# Patient Record
Sex: Female | Born: 1975 | Race: White | Hispanic: No | Marital: Single | Smoking: Never smoker
Health system: Southern US, Community
[De-identification: ages and names within clinical notes are randomized; demographics above are authoritative.]

## PROBLEM LIST (undated history)

## (undated) DIAGNOSIS — R002 Palpitations: Secondary | ICD-10-CM

## (undated) DIAGNOSIS — F419 Anxiety disorder, unspecified: Secondary | ICD-10-CM

## (undated) HISTORY — PX: OOPHORECTOMY: SHX86

## (undated) HISTORY — PX: CHOLECYSTECTOMY: SHX55

---

## 2000-06-09 ENCOUNTER — Inpatient Hospital Stay (HOSPITAL_COMMUNITY): Admission: AD | Admit: 2000-06-09 | Discharge: 2000-06-09 | Payer: Self-pay | Admitting: Obstetrics and Gynecology

## 2000-06-09 ENCOUNTER — Encounter: Payer: Self-pay | Admitting: Obstetrics and Gynecology

## 2001-09-19 ENCOUNTER — Encounter: Payer: Self-pay | Admitting: Emergency Medicine

## 2001-09-19 ENCOUNTER — Emergency Department (HOSPITAL_COMMUNITY): Admission: EM | Admit: 2001-09-19 | Discharge: 2001-09-19 | Payer: Self-pay | Admitting: Emergency Medicine

## 2002-06-01 ENCOUNTER — Emergency Department (HOSPITAL_COMMUNITY): Admission: EM | Admit: 2002-06-01 | Discharge: 2002-06-01 | Payer: Self-pay | Admitting: Emergency Medicine

## 2003-05-06 ENCOUNTER — Emergency Department (HOSPITAL_COMMUNITY): Admission: EM | Admit: 2003-05-06 | Discharge: 2003-05-06 | Payer: Self-pay | Admitting: Emergency Medicine

## 2004-05-08 ENCOUNTER — Inpatient Hospital Stay (HOSPITAL_COMMUNITY): Admission: AD | Admit: 2004-05-08 | Discharge: 2004-05-08 | Payer: Self-pay | Admitting: Obstetrics and Gynecology

## 2004-06-08 ENCOUNTER — Other Ambulatory Visit: Admission: RE | Admit: 2004-06-08 | Discharge: 2004-06-08 | Payer: Self-pay | Admitting: Obstetrics and Gynecology

## 2004-08-22 ENCOUNTER — Ambulatory Visit (HOSPITAL_COMMUNITY): Admission: RE | Admit: 2004-08-22 | Discharge: 2004-08-22 | Payer: Self-pay | Admitting: Obstetrics and Gynecology

## 2004-10-23 ENCOUNTER — Inpatient Hospital Stay (HOSPITAL_COMMUNITY): Admission: AD | Admit: 2004-10-23 | Discharge: 2004-10-23 | Payer: Self-pay | Admitting: Obstetrics and Gynecology

## 2005-01-02 ENCOUNTER — Inpatient Hospital Stay (HOSPITAL_COMMUNITY): Admission: AD | Admit: 2005-01-02 | Discharge: 2005-01-04 | Payer: Self-pay | Admitting: Obstetrics and Gynecology

## 2005-01-10 ENCOUNTER — Ambulatory Visit (HOSPITAL_COMMUNITY): Admission: RE | Admit: 2005-01-10 | Discharge: 2005-01-10 | Payer: Self-pay | Admitting: Obstetrics and Gynecology

## 2005-03-04 ENCOUNTER — Emergency Department (HOSPITAL_COMMUNITY): Admission: EM | Admit: 2005-03-04 | Discharge: 2005-03-05 | Payer: Self-pay | Admitting: Emergency Medicine

## 2005-04-20 ENCOUNTER — Emergency Department (HOSPITAL_COMMUNITY): Admission: EM | Admit: 2005-04-20 | Discharge: 2005-04-20 | Payer: Self-pay | Admitting: Emergency Medicine

## 2006-02-14 ENCOUNTER — Emergency Department (HOSPITAL_COMMUNITY): Admission: EM | Admit: 2006-02-14 | Discharge: 2006-02-14 | Payer: Self-pay | Admitting: Emergency Medicine

## 2006-05-30 ENCOUNTER — Emergency Department (HOSPITAL_COMMUNITY): Admission: EM | Admit: 2006-05-30 | Discharge: 2006-05-30 | Payer: Self-pay | Admitting: Emergency Medicine

## 2006-08-08 ENCOUNTER — Other Ambulatory Visit: Admission: RE | Admit: 2006-08-08 | Discharge: 2006-08-08 | Payer: Self-pay | Admitting: Obstetrics and Gynecology

## 2007-12-08 ENCOUNTER — Encounter: Admission: RE | Admit: 2007-12-08 | Discharge: 2007-12-08 | Payer: Self-pay | Admitting: Obstetrics and Gynecology

## 2007-12-10 ENCOUNTER — Encounter: Admission: RE | Admit: 2007-12-10 | Discharge: 2007-12-10 | Payer: Self-pay | Admitting: Obstetrics and Gynecology

## 2007-12-21 ENCOUNTER — Emergency Department (HOSPITAL_COMMUNITY): Admission: EM | Admit: 2007-12-21 | Discharge: 2007-12-21 | Payer: Self-pay | Admitting: Emergency Medicine

## 2007-12-30 ENCOUNTER — Emergency Department (HOSPITAL_COMMUNITY): Admission: EM | Admit: 2007-12-30 | Discharge: 2007-12-30 | Payer: Self-pay | Admitting: Emergency Medicine

## 2008-01-23 ENCOUNTER — Ambulatory Visit (HOSPITAL_COMMUNITY): Admission: RE | Admit: 2008-01-23 | Discharge: 2008-01-23 | Payer: Self-pay | Admitting: General Surgery

## 2008-04-14 ENCOUNTER — Emergency Department (HOSPITAL_COMMUNITY): Admission: EM | Admit: 2008-04-14 | Discharge: 2008-04-14 | Payer: Self-pay | Admitting: Emergency Medicine

## 2008-05-17 ENCOUNTER — Emergency Department (HOSPITAL_COMMUNITY): Admission: EM | Admit: 2008-05-17 | Discharge: 2008-05-17 | Payer: Self-pay | Admitting: Emergency Medicine

## 2009-08-26 ENCOUNTER — Emergency Department: Payer: Self-pay | Admitting: Emergency Medicine

## 2010-08-22 ENCOUNTER — Emergency Department: Payer: Self-pay | Admitting: Emergency Medicine

## 2011-01-07 ENCOUNTER — Encounter: Payer: Self-pay | Admitting: Obstetrics and Gynecology
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
## 2011-05-09 ENCOUNTER — Emergency Department: Payer: Self-pay | Admitting: Unknown Physician Specialty

## 2011-07-06 ENCOUNTER — Emergency Department (HOSPITAL_COMMUNITY)
Admission: EM | Admit: 2011-07-06 | Discharge: 2011-07-06 | Disposition: A | Discharge status: Home / Self Care | Payer: Medicaid Other | Attending: Emergency Medicine | Admitting: Emergency Medicine

## 2011-07-06 ENCOUNTER — Emergency Department (HOSPITAL_COMMUNITY): Payer: Medicaid Other

## 2011-07-06 DIAGNOSIS — R079 Chest pain, unspecified: Secondary | ICD-10-CM | POA: Insufficient documentation

## 2011-07-06 DIAGNOSIS — R0602 Shortness of breath: Secondary | ICD-10-CM | POA: Insufficient documentation

## 2011-07-06 DIAGNOSIS — R6884 Jaw pain: Secondary | ICD-10-CM | POA: Insufficient documentation

## 2011-07-06 DIAGNOSIS — R0989 Other specified symptoms and signs involving the circulatory and respiratory systems: Secondary | ICD-10-CM | POA: Insufficient documentation

## 2011-07-06 DIAGNOSIS — R209 Unspecified disturbances of skin sensation: Secondary | ICD-10-CM | POA: Insufficient documentation

## 2011-07-06 DIAGNOSIS — R0609 Other forms of dyspnea: Secondary | ICD-10-CM | POA: Insufficient documentation

## 2011-07-06 DIAGNOSIS — R110 Nausea: Secondary | ICD-10-CM | POA: Insufficient documentation

## 2011-07-06 DIAGNOSIS — M79609 Pain in unspecified limb: Secondary | ICD-10-CM | POA: Insufficient documentation

## 2011-07-06 LAB — URINALYSIS, ROUTINE W REFLEX MICROSCOPIC
Glucose, UA: NEGATIVE mg/dL
Leukocytes, UA: NEGATIVE
Specific Gravity, Urine: 1.011 (ref 1.005–1.030)
pH: 6 (ref 5.0–8.0)

## 2011-07-06 LAB — DIFFERENTIAL
Basophils Absolute: 0 10*3/uL (ref 0.0–0.1)
Eosinophils Absolute: 0.2 10*3/uL (ref 0.0–0.7)
Lymphocytes Relative: 21 % (ref 12–46)
Lymphs Abs: 1.9 10*3/uL (ref 0.7–4.0)

## 2011-07-06 LAB — URINE MICROSCOPIC-ADD ON

## 2011-07-06 LAB — POCT I-STAT, CHEM 8
Chloride: 104 mEq/L (ref 96–112)
Glucose, Bld: 91 mg/dL (ref 70–99)
HCT: 41 % (ref 36.0–46.0)
Potassium: 3.8 mEq/L (ref 3.5–5.1)
Sodium: 141 mEq/L (ref 135–145)

## 2011-07-06 LAB — CK TOTAL AND CKMB (NOT AT ARMC)
Relative Index: INVALID (ref 0.0–2.5)
Total CK: 41 U/L (ref 7–177)
Total CK: 39 U/L (ref 7–177)

## 2011-07-06 LAB — CBC
HCT: 37.8 % (ref 36.0–46.0)
MCH: 32.4 pg (ref 26.0–34.0)

## 2011-09-06 LAB — RAPID STREP SCREEN (MED CTR MEBANE ONLY): Streptococcus, Group A Screen (Direct): NEGATIVE

## 2011-09-07 LAB — URINE MICROSCOPIC-ADD ON

## 2011-09-07 LAB — URINALYSIS, ROUTINE W REFLEX MICROSCOPIC
Glucose, UA: NEGATIVE
Ketones, ur: NEGATIVE
Nitrite: NEGATIVE

## 2011-09-07 LAB — CBC
HCT: 38
Platelets: 257
RBC: 4.28
RDW: 12.7
WBC: 7.4

## 2011-09-07 LAB — DIFFERENTIAL
Basophils Relative: 0
Lymphs Abs: 2.4
Monocytes Absolute: 0.4
Monocytes Relative: 6
Neutrophils Relative %: 59

## 2011-09-07 LAB — COMPREHENSIVE METABOLIC PANEL
ALT: 19
AST: 17
Albumin: 3.5
CO2: 25
Calcium: 9.2
Sodium: 135

## 2011-09-07 LAB — PROTIME-INR: Prothrombin Time: 12.5

## 2011-09-13 LAB — POCT INFECTIOUS MONO SCREEN: Mono Screen: NEGATIVE

## 2011-09-13 LAB — POCT RAPID STREP A: Streptococcus, Group A Screen (Direct): NEGATIVE

## 2011-09-27 ENCOUNTER — Ambulatory Visit: Payer: Self-pay | Admitting: Emergency Medicine

## 2011-10-04 ENCOUNTER — Ambulatory Visit: Payer: Self-pay | Admitting: Emergency Medicine

## 2011-10-30 ENCOUNTER — Encounter: Payer: Self-pay | Admitting: *Deleted

## 2011-10-30 ENCOUNTER — Other Ambulatory Visit: Payer: Self-pay

## 2011-10-30 ENCOUNTER — Emergency Department (HOSPITAL_COMMUNITY): Payer: Medicaid Other

## 2011-10-30 ENCOUNTER — Emergency Department (HOSPITAL_COMMUNITY)
Admission: EM | Admit: 2011-10-30 | Discharge: 2011-10-30 | Disposition: A | Discharge status: Home / Self Care | Payer: Medicaid Other | Attending: Emergency Medicine | Admitting: Emergency Medicine

## 2011-10-30 DIAGNOSIS — R079 Chest pain, unspecified: Secondary | ICD-10-CM | POA: Insufficient documentation

## 2011-10-30 DIAGNOSIS — K219 Gastro-esophageal reflux disease without esophagitis: Secondary | ICD-10-CM | POA: Insufficient documentation

## 2011-10-30 DIAGNOSIS — R0602 Shortness of breath: Secondary | ICD-10-CM | POA: Insufficient documentation

## 2011-10-30 LAB — POCT I-STAT TROPONIN I

## 2011-10-30 LAB — POCT I-STAT, CHEM 8
Creatinine, Ser: 0.8 mg/dL (ref 0.50–1.10)
HCT: 40 % (ref 36.0–46.0)
Hemoglobin: 13.6 g/dL (ref 12.0–15.0)
Potassium: 3.6 mEq/L (ref 3.5–5.1)
Sodium: 139 mEq/L (ref 135–145)
TCO2: 26 mmol/L (ref 0–100)

## 2011-10-30 MED ORDER — ESOMEPRAZOLE MAGNESIUM 40 MG PO CPDR: DELAYED_RELEASE_CAPSULE | ORAL | Status: AC

## 2011-10-30 MED ORDER — SUCRALFATE 1 G PO TABS: 1.0000 g | ORAL_TABLET | Freq: Four times a day (QID) | ORAL | Status: AC

## 2011-10-30 NOTE — ED Provider Notes (Signed)
History     CSN: [id number] Arrival date & time: 10/30/2011 12:11 PM   First MD Initiated Contact with Patient 10/30/11 1354      Chief Complaint  Patient presents with  . Chest Pain    (Consider location/radiation/quality/duration/timing/severity/associated sxs/prior treatment) HPI To ed for eval of sob and chest pain. Describes as burning pain. Intermittent. Pain and sob started past gallbladder surgery 4 wks ago. States past surgery she felt 'bad' upper thigh pain. Seen by pmd today and has endo scheduled and cardiology appt next week. Pt appears in nad. Skin w/d, resp e/u.  Had similar complaint and ED visit last month.  History of hiatal hernia which she takes Nexium for.  Describes "burning" .  Not pleuritic.  No hx of DVT or PE.  No extremity swelling.  No diaphoresis.    History reviewed. No pertinent past medical history.  Past Surgical History  Procedure Date  . Cholecystectomy   . Oophorectomy     History reviewed. No pertinent family history.  History  Substance Use Topics  . Smoking status: Not on file  . Smokeless tobacco: Not on file  . Alcohol Use: No    OB History    Grav Para Term Preterm Abortions TAB SAB Ect Mult Living                  Review of Systems  Constitutional: Negative.   HENT: Negative.   Eyes: Negative.   Musculoskeletal: Negative.   Neurological: Negative.   Hematological: Negative.   Psychiatric/Behavioral: Negative.     Allergies  Sulfa antibiotics  Home Medications   Current Outpatient Rx  Name Route Sig Dispense Refill  . ALPRAZOLAM 0.5 MG PO TABS Oral Take 0.5 mg by mouth 2 (two) times daily as needed. For anxiety     . VITAMIN C 1000 MG PO TABS Oral Take 1,000 mg by mouth daily.      Marland Kitchen VITAMIN D 1000 UNITS PO TABS Oral Take 1,000 Units by mouth daily.      Marland Kitchen ESOMEPRAZOLE MAGNESIUM 40 MG PO CPDR Oral Take 40 mg by mouth daily before breakfast.      . IBUPROFEN 800 MG PO TABS Oral Take 800 mg by mouth every 8 (eight)  hours as needed. For pain     . VITAMIN B-12 1000 MCG PO TABS Oral Take 1,000 mcg by mouth daily.      Marland Kitchen VITAMIN E 400 UNITS PO CAPS Oral Take 800 Units by mouth daily.        BP 121/81  Pulse 93  Temp(Src) 98.2 F (36.8 C) (Oral)  Resp 20  SpO2 99%  LMP 10/09/2011  Physical Exam  Nursing note and vitals reviewed. Constitutional: She is oriented to person, place, and time. She appears well-developed and well-nourished. No distress.  HENT:  Head: Normocephalic and atraumatic.  Eyes: Pupils are equal, round, and reactive to light.  Neck: Normal range of motion.  Cardiovascular: Normal rate, regular rhythm and intact distal pulses.  Exam reveals no friction rub.   No murmur heard.        Date: 10/30/2011  Rate: 91  Rhythm: normal sinus rhythm  QRS Axis: normal  Intervals: normal  ST/T Wave abnormalities: normal  Conduction Disutrbances:none  Narrative Interpretation:   Old EKG Reviewed: unchanged     Pulmonary/Chest: No respiratory distress. She has no wheezes. She has no rales.  Abdominal: Normal appearance. She exhibits no distension.  Musculoskeletal: Normal range of motion.  Neurological: She  is alert and oriented to person, place, and time. No cranial nerve deficit.  Skin: Skin is warm and dry. No rash noted.  Psychiatric: She has a normal mood and affect. Her behavior is normal.    ED Course  Procedures (including critical care time)   Well's<4 Low prob of PE.  Labs Reviewed  D-DIMER, QUANTITATIVE - Abnormal; Notable for the following:    D-Dimer, Quant 0.52 (*)    All other components within normal limits  POCT I-STAT, CHEM 8  POCT I-STAT TROPONIN I   Dg Chest 2 View  10/30/2011  *RADIOLOGY REPORT*  Clinical Data: Shortness of breath, chest pain  CHEST - 2 VIEW  Comparison: Portable chest x-ray of 07/06/2011  Findings: The lungs are clear.  Mediastinal contours appear normal. The heart is within normal limits in size.  No bony abnormality is seen.   IMPRESSION: No active lung disease.  Original Report Authenticated By: Juline Patch, M.D.     1. GERD (gastroesophageal reflux disease)       MDM          Nelia Shi, MD 10/30/11 2145

## 2011-10-30 NOTE — ED Notes (Signed)
Pt here with c/o chest pain all over that started 3 weeks ago and reports that it has been intermittent.  Pt reports sob and reports radiation into her back.  Pt alert and oriented x 4.

## 2011-10-30 NOTE — ED Notes (Signed)
To ed for eval of sob and chest pain. Describes as burning pain. Intermittent. Pain and sob started past gallbladder surgery 4 wks ago. States past surgery she felt 'bad' upper thigh pain. Seen by pmd today and has endo scheduled and cardiology appt next week. Pt appears in nad. Skin w/d, resp e/u

## 2012-01-26 ENCOUNTER — Emergency Department: Payer: Self-pay | Admitting: Emergency Medicine

## 2012-01-26 LAB — COMPREHENSIVE METABOLIC PANEL
Albumin: 3.6 g/dL (ref 3.4–5.0)
Chloride: 107 mmol/L (ref 98–107)
Co2: 26 mmol/L (ref 21–32)
Creatinine: 0.52 mg/dL — ABNORMAL LOW (ref 0.60–1.30)
Potassium: 4 mmol/L (ref 3.5–5.1)
SGOT(AST): 24 U/L (ref 15–37)
SGPT (ALT): 32 U/L
Total Protein: 7.2 g/dL (ref 6.4–8.2)

## 2012-01-26 LAB — CBC
HCT: 40.3 % (ref 35.0–47.0)
HGB: 13.4 g/dL (ref 12.0–16.0)
MCH: 31.2 pg (ref 26.0–34.0)
MCV: 93 fL (ref 80–100)
RBC: 4.31 10*6/uL (ref 3.80–5.20)

## 2012-01-26 LAB — URINALYSIS, COMPLETE
Blood: NEGATIVE
Leukocyte Esterase: NEGATIVE
Nitrite: NEGATIVE
Ph: 5 (ref 4.5–8.0)
Protein: NEGATIVE

## 2012-01-26 LAB — LIPASE, BLOOD: Lipase: 62 U/L — ABNORMAL LOW (ref 73–393)

## 2012-03-28 ENCOUNTER — Emergency Department: Payer: Self-pay | Admitting: Internal Medicine

## 2012-07-04 IMAGING — CR DG CHEST 1V PORT
1 series · 1 of 1 positions shown · non-contrast
Comparison: 02/14/2006

CLINICAL DATA: Chest pain and shortness of breath.  Nausea.
Numbness and tingling in the left arm.

PORTABLE CHEST - 1 VIEW

[AP]
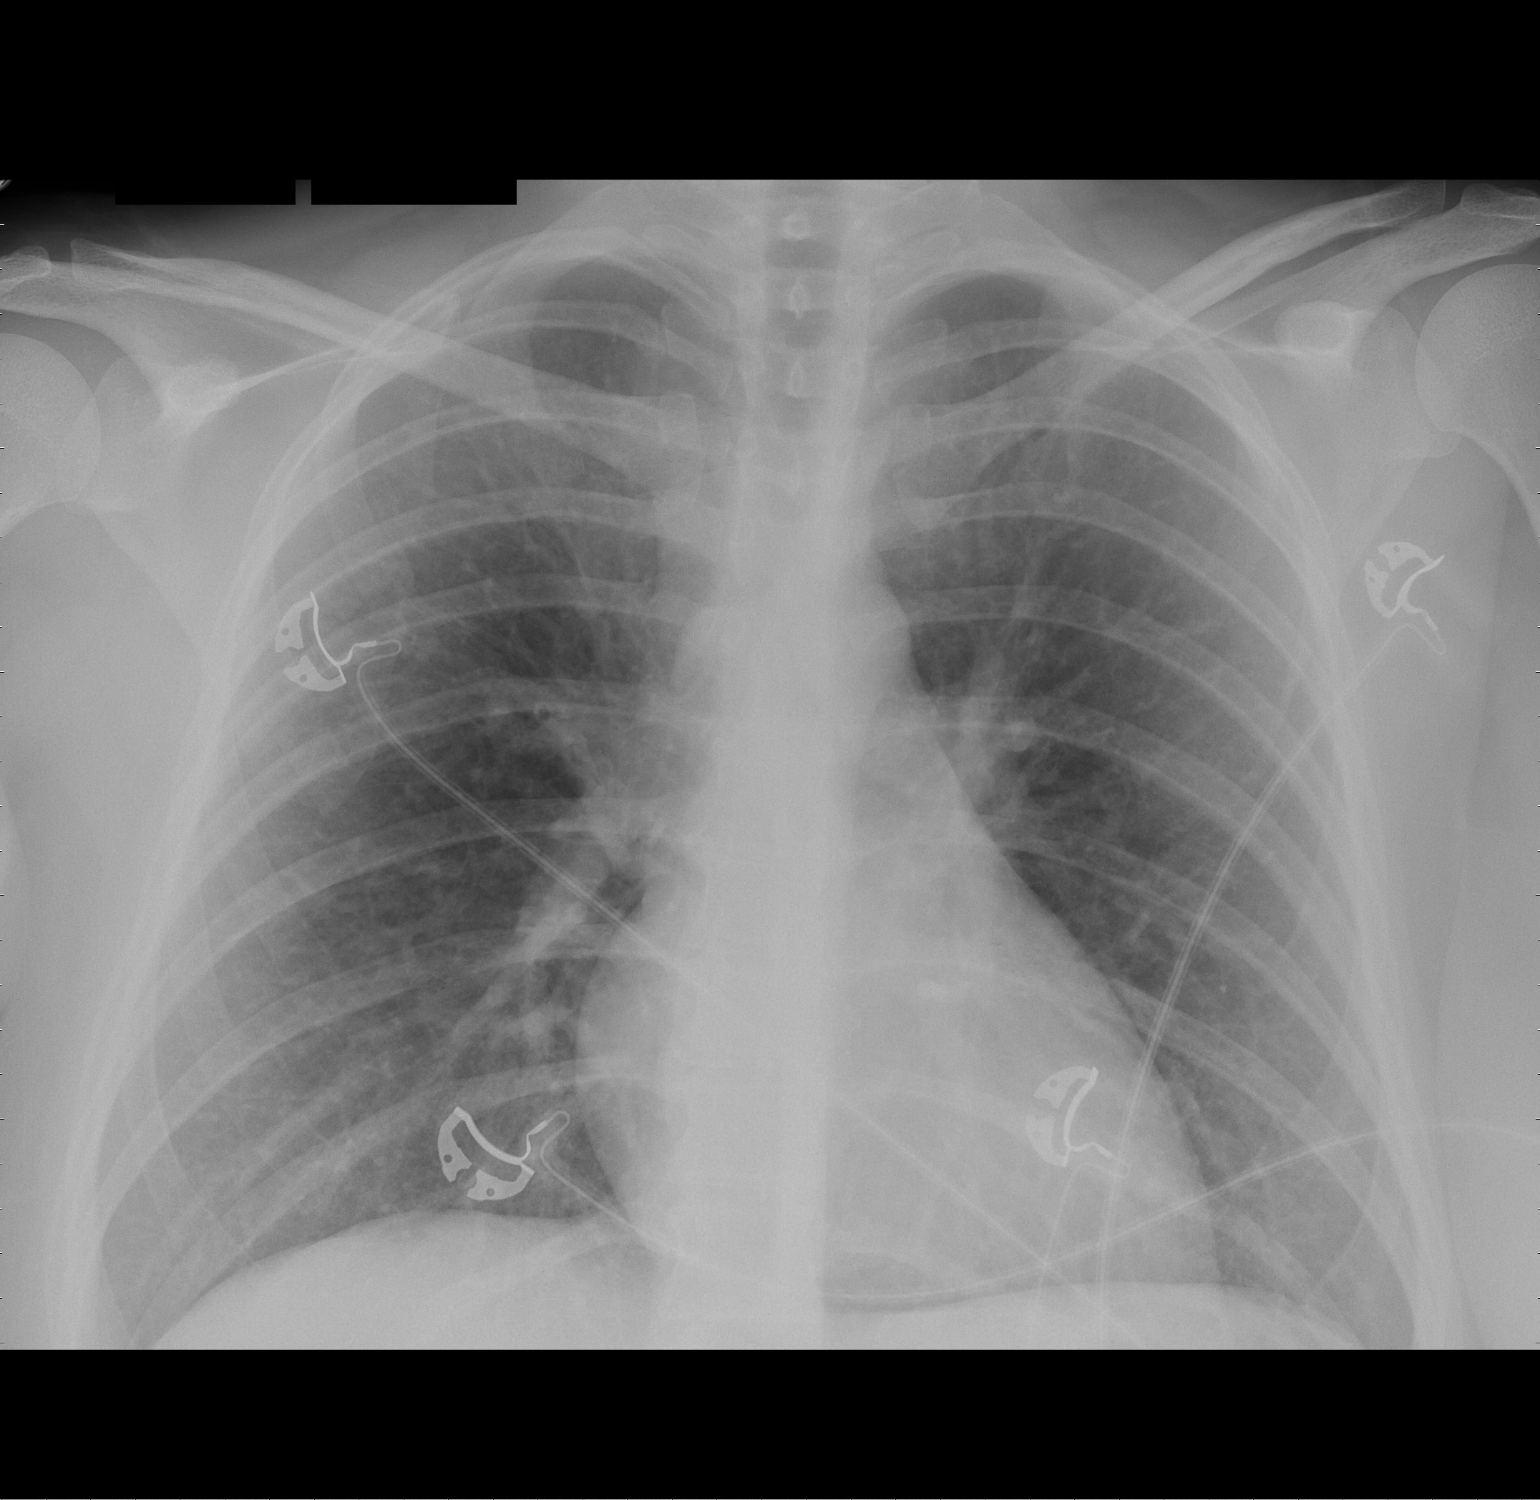

[1 of 1 positions shown; findings below may reference images not displayed]

FINDINGS: Heart size and vascularity are normal and the lungs are
clear.  No osseous abnormality.
IMPRESSION: Normal exam.

## 2012-09-26 ENCOUNTER — Emergency Department: Payer: Self-pay | Admitting: Emergency Medicine

## 2012-09-26 LAB — CBC
HCT: 39.1 % (ref 35.0–47.0)
MCH: 31.7 pg (ref 26.0–34.0)
MCV: 95 fL (ref 80–100)
Platelet: 232 10*3/uL (ref 150–440)
RDW: 12.8 % (ref 11.5–14.5)

## 2012-09-26 LAB — COMPREHENSIVE METABOLIC PANEL
Albumin: 3.9 g/dL (ref 3.4–5.0)
Alkaline Phosphatase: 76 U/L (ref 50–136)
BUN: 12 mg/dL (ref 7–18)
SGOT(AST): 16 U/L (ref 15–37)
SGPT (ALT): 21 U/L (ref 12–78)
Total Protein: 7.8 g/dL (ref 6.4–8.2)

## 2012-09-26 LAB — CK TOTAL AND CKMB (NOT AT ARMC)
CK, Total: 73 U/L (ref 21–215)
CK-MB: 2 ng/mL (ref 0.5–3.6)

## 2013-01-19 ENCOUNTER — Emergency Department: Payer: Self-pay | Admitting: Emergency Medicine

## 2013-01-19 LAB — COMPREHENSIVE METABOLIC PANEL
Albumin: 4 g/dL (ref 3.4–5.0)
Alkaline Phosphatase: 71 U/L (ref 50–136)
BUN: 14 mg/dL (ref 7–18)
Chloride: 105 mmol/L (ref 98–107)
Co2: 32 mmol/L (ref 21–32)
Creatinine: 0.78 mg/dL (ref 0.60–1.30)
EGFR (Non-African Amer.): >60
Glucose: 83 mg/dL (ref 65–99)
Osmolality: 279 (ref 275–301)
SGPT (ALT): 20 U/L (ref 12–78)
Sodium: 140 mmol/L (ref 136–145)
Total Protein: 7.7 g/dL (ref 6.4–8.2)

## 2013-01-19 LAB — URINALYSIS, COMPLETE
Blood: NEGATIVE
Ketone: NEGATIVE
Nitrite: NEGATIVE
RBC,UR: 2 /HPF (ref 0–5)
Specific Gravity: 1.014 (ref 1.003–1.030)
Squamous Epithelial: 10
WBC UR: 2 /HPF (ref 0–5)

## 2013-01-19 LAB — CBC
HGB: 13.5 g/dL (ref 12.0–16.0)
MCH: 30.2 pg (ref 26.0–34.0)
MCV: 93 fL (ref 80–100)
Platelet: 232 10*3/uL (ref 150–440)
RBC: 4.46 10*6/uL (ref 3.80–5.20)

## 2013-01-19 LAB — LIPASE, BLOOD: Lipase: 73 U/L (ref 73–393)

## 2013-05-13 ENCOUNTER — Emergency Department: Payer: Self-pay | Admitting: Emergency Medicine

## 2014-01-18 IMAGING — CR DG ABDOMEN 2V
1 series · 2 of 2 positions shown · non-contrast
Comparison: none

REASON FOR EXAM: LUQ PAIN
COMMENTS:   May transport without cardiac monitor

[Series 4: w abdomen upright · 0.14mm/px · 2 of 2 slices shown]
[im 1/2]
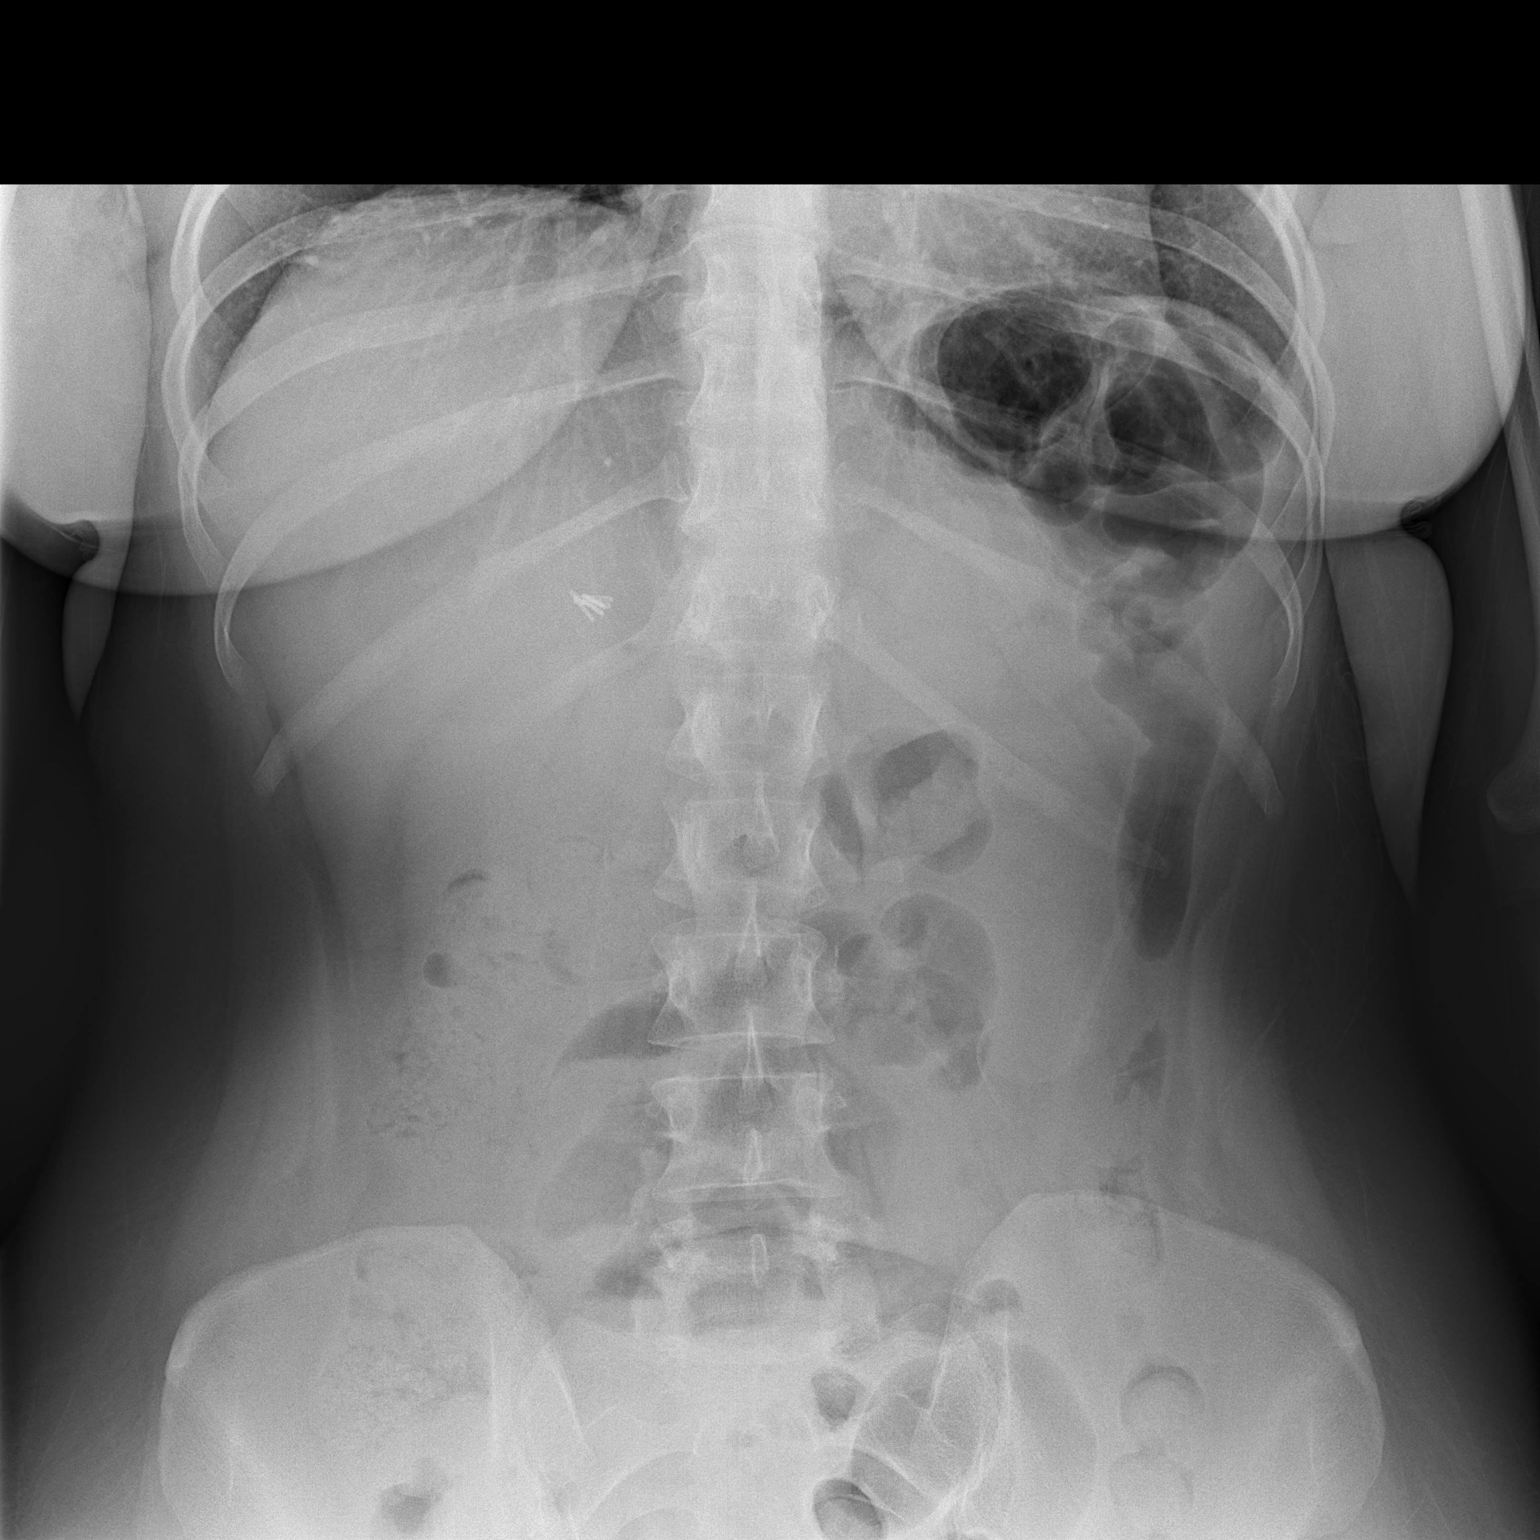
[im 2/2]
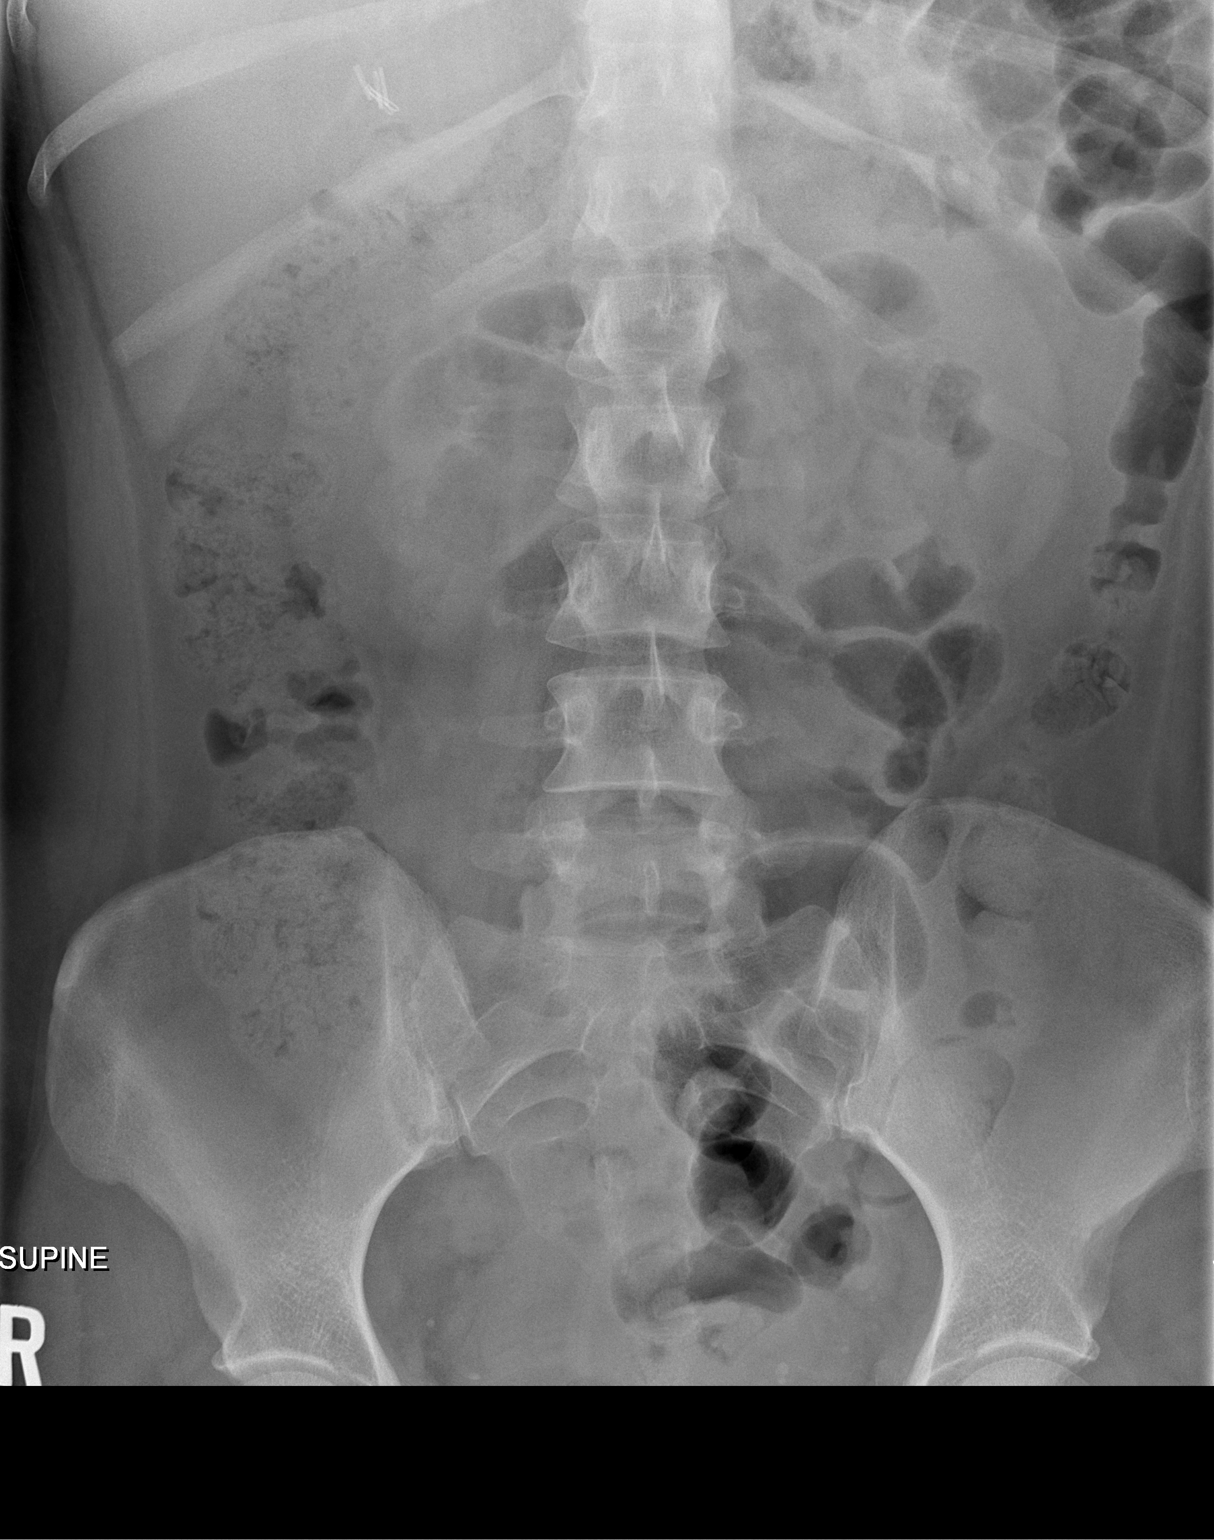

[2 of 2 positions shown; findings below may reference images not displayed]

PROCEDURE:     DXR - DXR ABDOMEN 2 V FLAT AND ERECT  - January 19, 2013  [DATE]

RESULT:     Supine and upright abdominal films reveal a normal bowel gas
pattern. There may be an element of constipation present. There are surgical
clips in the gallbladder fossa. No abnormal intra-abdominal fluid
collections are demonstrated. There are phleboliths within the pelvis. I do
not see evidence of calcifications projecting over either kidney or along
the course of the ureters. The bony structures appear normal.
IMPRESSION: 1.There is no evidence of bowel obstruction or ileus. I cannot exclude an
element of constipation.
2. There are no findings suspicious for calcified urinary tract stones.

[REDACTED]

## 2014-03-17 ENCOUNTER — Ambulatory Visit: Payer: Self-pay

## 2014-03-19 ENCOUNTER — Emergency Department: Payer: Self-pay | Admitting: Emergency Medicine

## 2014-03-19 LAB — TROPONIN I: Troponin-I: <0.02 ng/mL

## 2014-03-19 LAB — CBC
HCT: 42.5 % (ref 35.0–47.0)
HGB: 14.2 g/dL (ref 12.0–16.0)
MCH: 31.6 pg (ref 26.0–34.0)
MCHC: 33.4 g/dL (ref 32.0–36.0)
MCV: 95 fL (ref 80–100)
Platelet: 214 10*3/uL (ref 150–440)
RBC: 4.49 10*6/uL (ref 3.80–5.20)
RDW: 13 % (ref 11.5–14.5)
WBC: 8.8 10*3/uL (ref 3.6–11.0)

## 2014-03-19 LAB — BASIC METABOLIC PANEL
Anion Gap: 3 — ABNORMAL LOW (ref 7–16)
BUN: 13 mg/dL (ref 7–18)
CALCIUM: 8.9 mg/dL (ref 8.5–10.1)
Chloride: 104 mmol/L (ref 98–107)
Co2: 29 mmol/L (ref 21–32)
Creatinine: 0.68 mg/dL (ref 0.60–1.30)
GLUCOSE: 87 mg/dL (ref 65–99)
OSMOLALITY: 271 (ref 275–301)
POTASSIUM: 3.8 mmol/L (ref 3.5–5.1)
SODIUM: 136 mmol/L (ref 136–145)

## 2014-03-26 ENCOUNTER — Encounter (HOSPITAL_COMMUNITY): Payer: Self-pay | Admitting: Emergency Medicine

## 2014-03-26 ENCOUNTER — Emergency Department (HOSPITAL_COMMUNITY)
Admission: EM | Admit: 2014-03-26 | Discharge: 2014-03-26 | Disposition: A | Discharge status: Home / Self Care | Payer: Self-pay | Attending: Emergency Medicine | Admitting: Emergency Medicine

## 2014-03-26 DIAGNOSIS — Z3202 Encounter for pregnancy test, result negative: Secondary | ICD-10-CM | POA: Insufficient documentation

## 2014-03-26 DIAGNOSIS — N390 Urinary tract infection, site not specified: Secondary | ICD-10-CM | POA: Insufficient documentation

## 2014-03-26 LAB — URINALYSIS, ROUTINE W REFLEX MICROSCOPIC
Bilirubin Urine: NEGATIVE
Glucose, UA: NEGATIVE mg/dL
Ketones, ur: NEGATIVE mg/dL
Nitrite: POSITIVE — AB
Protein, ur: NEGATIVE mg/dL
Specific Gravity, Urine: 1.004 — ABNORMAL LOW (ref 1.005–1.030)
Urobilinogen, UA: 0.2 mg/dL (ref 0.0–1.0)
pH: 6.5 (ref 5.0–8.0)

## 2014-03-26 LAB — URINE MICROSCOPIC-ADD ON

## 2014-03-26 LAB — POC URINE PREG, ED: PREG TEST UR: NEGATIVE

## 2014-03-26 MED ORDER — PHENAZOPYRIDINE HCL 100 MG PO TABS
100.0000 mg | ORAL_TABLET | Freq: Three times a day (TID) | ORAL | Status: DC
  Administered 2014-03-26: 100 mg via ORAL
  Filled 2014-03-26: qty 1

## 2014-03-26 MED ORDER — CEPHALEXIN 250 MG PO CAPS: 250.0000 mg | ORAL_CAPSULE | Freq: Four times a day (QID) | ORAL | Status: AC

## 2014-03-26 MED ORDER — CEPHALEXIN 250 MG PO CAPS
250.0000 mg | ORAL_CAPSULE | Freq: Once | ORAL | Status: AC
  Administered 2014-03-26: 250 mg via ORAL
  Filled 2014-03-26: qty 1

## 2014-03-26 MED ORDER — PHENAZOPYRIDINE HCL 100 MG PO TABS: 100.0000 mg | ORAL_TABLET | Freq: Three times a day (TID) | ORAL | Status: AC

## 2014-03-26 NOTE — ED Notes (Signed)
Pt has frequency, dsyuria, abd lower back pain, for 3 days. States that she feels like it is a uti. Nausea.

## 2014-03-26 NOTE — ED Provider Notes (Signed)
CSN: [id number]     Arrival date & time 03/26/14  1731 History  This chart was scribed for non-physician practitioner, Sabino Dick, NP-C working with Richardean Canal, MD by Luisa Dago, ED scribe. This patient was seen in room WTR6/WTR6 and the patient's care was started at 9:34 PM.    Chief Complaint  Patient presents with  . Abdominal Pain  . Urinary Frequency   The history is provided by the patient. No language interpreter was used.   HPI Comments: Breanna Stevens is a 38 y.o. female who presents to the Emergency Department complaining of abdominal pain that started 3 days ago. Pt is also complaining of associated urinary frequency. She states that she's been taking OTC medication (AZO) to relieve her symptoms without any relief. Denies any fever, chills, diaphoresis, nausea, or emesis.   History reviewed. No pertinent past medical history. History reviewed. No pertinent past surgical history. No family history on file. History  Substance Use Topics  . Smoking status: Not on file  . Smokeless tobacco: Not on file  . Alcohol Use: Not on file   OB History   Grav Para Term Preterm Abortions TAB SAB Ect Mult Living                 Review of Systems  Constitutional: Negative for fever, chills and diaphoresis.  HENT: Negative for congestion.   Respiratory: Negative for cough and shortness of breath.   Cardiovascular: Negative for chest pain.  Gastrointestinal: Positive for abdominal pain. Negative for nausea and vomiting.  Genitourinary: Positive for dysuria and frequency. Negative for vaginal discharge.  Musculoskeletal: Negative for myalgias.  Skin: Negative for rash.  Neurological: Negative for headaches.  All other systems reviewed and are negative.     Allergies  Review of patient's allergies indicates not on file.  Home Medications   Current Outpatient Rx  Name  Route  Sig  Dispense  Refill  . cephALEXin (KEFLEX) 250 MG capsule   Oral   Take 1 capsule (250 mg  total) by mouth 4 (four) times daily.   27 capsule   0   . phenazopyridine (PYRIDIUM) 100 MG tablet   Oral   Take 1 tablet (100 mg total) by mouth 3 (three) times daily with meals.   10 tablet   0     BP 125/78  Pulse 89  Temp(Src) 98 F (36.7 C) (Oral)  Resp 22  SpO2 100%  Physical Exam  Nursing note and vitals reviewed. Constitutional: She appears well-developed and well-nourished. No distress.  HENT:  Head: Normocephalic and atraumatic.  Eyes: Conjunctivae are normal.  Neck: Normal range of motion.  Cardiovascular: Normal rate, regular rhythm and normal heart sounds.   Pulmonary/Chest: Effort normal and breath sounds normal.  Abdominal: Soft. She exhibits no distension. There is no tenderness.  Musculoskeletal: Normal range of motion.  Neurological: She is alert.  Skin: Skin is warm and dry.  Psychiatric: She has a normal mood and affect. Her behavior is normal. Thought content normal.    ED Course  Procedures (including critical care time)  DIAGNOSTIC STUDIES: Oxygen Saturation is 100% on RA, normal by my interpretation.    COORDINATION OF CARE: 9:36 PM- Will prescribe antibiotics. Pt requesting a note for work. Pt advised of plan for treatment and pt agrees.  Labs Review Labs Reviewed  URINALYSIS, ROUTINE W REFLEX MICROSCOPIC - Abnormal; Notable for the following:    Color, Urine AMBER (*)    APPearance CLOUDY (*)  Specific Gravity, Urine 1.004 (*)    Hgb urine dipstick SMALL (*)    Nitrite POSITIVE (*)    Leukocytes, UA MODERATE (*)    All other components within normal limits  URINE MICROSCOPIC-ADD ON - Abnormal; Notable for the following:    Bacteria, UA FEW (*)    All other components within normal limits  POC URINE PREG, ED   Imaging Review No results found.   EKG Interpretation None      MDM  Patient has positive nitrates, positive leukocytes.  Will treat with Pyridium and Keflex Final diagnoses:  UTI (lower urinary tract infection)        I personally performed the services described in this documentation, which was scribed in my presence. The recorded information has been reviewed and is accurate.    Arman FilterGail K Lakeesha Fontanilla, NP 03/26/14 2155

## 2014-03-26 NOTE — Discharge Instructions (Signed)
Take the medication as directed until completed °

## 2014-03-27 NOTE — ED Provider Notes (Signed)
Medical screening examination/treatment/procedure(s) were performed by non-physician practitioner and as supervising physician I was immediately available for consultation/collaboration.   EKG Interpretation None        Richardean Canalavid H Lonnel Gjerde, MD 03/27/14 1049

## 2014-04-11 ENCOUNTER — Emergency Department: Payer: Self-pay

## 2014-06-20 ENCOUNTER — Emergency Department: Payer: Self-pay | Admitting: Emergency Medicine

## 2014-06-20 LAB — TROPONIN I

## 2014-06-20 LAB — BASIC METABOLIC PANEL
Anion Gap: 8 (ref 7–16)
BUN: 14 mg/dL (ref 7–18)
CALCIUM: 9.3 mg/dL (ref 8.5–10.1)
CHLORIDE: 105 mmol/L (ref 98–107)
Co2: 26 mmol/L (ref 21–32)
Creatinine: 0.84 mg/dL (ref 0.60–1.30)
EGFR (Non-African Amer.): >60
GLUCOSE: 83 mg/dL (ref 65–99)
OSMOLALITY: 277 (ref 275–301)
POTASSIUM: 3.8 mmol/L (ref 3.5–5.1)
SODIUM: 139 mmol/L (ref 136–145)

## 2014-06-20 LAB — CBC
HCT: 43.2 % (ref 35.0–47.0)
HGB: 14 g/dL (ref 12.0–16.0)
MCH: 30.8 pg (ref 26.0–34.0)
MCHC: 32.4 g/dL (ref 32.0–36.0)
MCV: 95 fL (ref 80–100)
PLATELETS: 221 10*3/uL (ref 150–440)
RBC: 4.54 10*6/uL (ref 3.80–5.20)
RDW: 13 % (ref 11.5–14.5)
WBC: 12.2 10*3/uL — AB (ref 3.6–11.0)

## 2014-07-25 ENCOUNTER — Emergency Department: Payer: Self-pay | Admitting: Emergency Medicine

## 2014-07-25 LAB — URINALYSIS, COMPLETE
Bilirubin,UR: NEGATIVE
Blood: NEGATIVE
Glucose,UR: NEGATIVE mg/dL (ref 0–75)
Hyaline Cast: 2
Ketone: NEGATIVE
Leukocyte Esterase: NEGATIVE
Nitrite: NEGATIVE
Ph: 6 (ref 4.5–8.0)
Protein: NEGATIVE
RBC,UR: 3 /HPF (ref 0–5)
Specific Gravity: 1.018 (ref 1.003–1.030)
Squamous Epithelial: 3
WBC UR: 1 /HPF (ref 0–5)

## 2014-07-25 LAB — CBC WITH DIFFERENTIAL/PLATELET
BASOS ABS: 0.1 10*3/uL (ref 0.0–0.1)
Basophil %: 0.5 %
EOS ABS: 0.4 10*3/uL (ref 0.0–0.7)
EOS PCT: 3.5 %
HCT: 40.5 % (ref 35.0–47.0)
HGB: 13.4 g/dL (ref 12.0–16.0)
LYMPHS ABS: 3.2 10*3/uL (ref 1.0–3.6)
Lymphocyte %: 28.5 %
MCH: 31.9 pg (ref 26.0–34.0)
MCHC: 33.1 g/dL (ref 32.0–36.0)
MCV: 97 fL (ref 80–100)
MONOS PCT: 5.8 %
Monocyte #: 0.6 x10 3/mm (ref 0.2–0.9)
Neutrophil #: 6.9 10*3/uL — ABNORMAL HIGH (ref 1.4–6.5)
Neutrophil %: 61.7 %
PLATELETS: 213 10*3/uL (ref 150–440)
RBC: 4.2 10*6/uL (ref 3.80–5.20)
RDW: 13 % (ref 11.5–14.5)
WBC: 11.2 10*3/uL — AB (ref 3.6–11.0)

## 2014-07-25 LAB — COMPREHENSIVE METABOLIC PANEL
ALK PHOS: 59 U/L
ALT: 19 U/L
ANION GAP: 9 (ref 7–16)
Albumin: 3.3 g/dL — ABNORMAL LOW (ref 3.4–5.0)
BILIRUBIN TOTAL: 0.2 mg/dL (ref 0.2–1.0)
BUN: 16 mg/dL (ref 7–18)
CALCIUM: 8.6 mg/dL (ref 8.5–10.1)
CHLORIDE: 106 mmol/L (ref 98–107)
Co2: 25 mmol/L (ref 21–32)
Creatinine: 0.77 mg/dL (ref 0.60–1.30)
EGFR (African American): >60
GLUCOSE: 101 mg/dL — AB (ref 65–99)
Osmolality: 281 (ref 275–301)
POTASSIUM: 3.9 mmol/L (ref 3.5–5.1)
SGOT(AST): 22 U/L (ref 15–37)
Sodium: 140 mmol/L (ref 136–145)
TOTAL PROTEIN: 6.9 g/dL (ref 6.4–8.2)

## 2014-07-25 LAB — TROPONIN I

## 2014-07-25 LAB — TSH: Thyroid Stimulating Horm: 1.33 u[IU]/mL

## 2014-07-27 ENCOUNTER — Ambulatory Visit: Payer: Self-pay | Admitting: Cardiovascular Disease

## 2014-09-19 ENCOUNTER — Emergency Department: Payer: Self-pay | Admitting: Emergency Medicine

## 2014-11-17 ENCOUNTER — Emergency Department: Payer: Self-pay | Admitting: Emergency Medicine

## 2014-11-17 LAB — CBC WITH DIFFERENTIAL/PLATELET
BASOS PCT: 0.7 %
Basophil #: 0.1 10*3/uL (ref 0.0–0.1)
EOS PCT: 3.2 %
Eosinophil #: 0.3 10*3/uL (ref 0.0–0.7)
HCT: 42 % (ref 35.0–47.0)
HGB: 13.8 g/dL (ref 12.0–16.0)
LYMPHS ABS: 2.6 10*3/uL (ref 1.0–3.6)
Lymphocyte %: 26.8 %
MCH: 31.1 pg (ref 26.0–34.0)
MCHC: 32.8 g/dL (ref 32.0–36.0)
MCV: 95 fL (ref 80–100)
MONO ABS: 0.6 x10 3/mm (ref 0.2–0.9)
Monocyte %: 6.4 %
Neutrophil #: 6.1 10*3/uL (ref 1.4–6.5)
Neutrophil %: 62.9 %
Platelet: 239 10*3/uL (ref 150–440)
RBC: 4.43 10*6/uL (ref 3.80–5.20)
RDW: 13 % (ref 11.5–14.5)
WBC: 9.7 10*3/uL (ref 3.6–11.0)

## 2014-11-17 LAB — BASIC METABOLIC PANEL
Anion Gap: 7 (ref 7–16)
BUN: 16 mg/dL (ref 7–18)
CREATININE: 0.66 mg/dL (ref 0.60–1.30)
Calcium, Total: 9 mg/dL (ref 8.5–10.1)
Chloride: 105 mmol/L (ref 98–107)
Co2: 25 mmol/L (ref 21–32)
EGFR (African American): >60
EGFR (Non-African Amer.): >60
Glucose: 93 mg/dL (ref 65–99)
Osmolality: 275 (ref 275–301)
Potassium: 3.9 mmol/L (ref 3.5–5.1)
Sodium: 137 mmol/L (ref 136–145)

## 2014-12-25 ENCOUNTER — Emergency Department: Payer: Self-pay | Admitting: Student

## 2014-12-25 LAB — BASIC METABOLIC PANEL
ANION GAP: 7 (ref 7–16)
BUN: 18 mg/dL (ref 7–18)
CREATININE: 0.71 mg/dL (ref 0.60–1.30)
Calcium, Total: 9.3 mg/dL (ref 8.5–10.1)
Chloride: 104 mmol/L (ref 98–107)
Co2: 27 mmol/L (ref 21–32)
EGFR (Non-African Amer.): >60
Glucose: 90 mg/dL (ref 65–99)
Osmolality: 277 (ref 275–301)
Potassium: 4.5 mmol/L (ref 3.5–5.1)
Sodium: 138 mmol/L (ref 136–145)

## 2014-12-25 LAB — CBC
HCT: 40.9 % (ref 35.0–47.0)
HGB: 13.7 g/dL (ref 12.0–16.0)
MCH: 31.8 pg (ref 26.0–34.0)
MCHC: 33.6 g/dL (ref 32.0–36.0)
MCV: 95 fL (ref 80–100)
PLATELETS: 253 10*3/uL (ref 150–440)
RBC: 4.32 10*6/uL (ref 3.80–5.20)
RDW: 13.3 % (ref 11.5–14.5)
WBC: 8.4 10*3/uL (ref 3.6–11.0)

## 2014-12-25 LAB — TROPONIN I: Troponin-I: <0.02 ng/mL

## 2014-12-25 LAB — HCG, QUANTITATIVE, PREGNANCY: Beta Hcg, Quant.: <1 m[IU]/mL — ABNORMAL LOW

## 2014-12-30 ENCOUNTER — Emergency Department: Payer: Self-pay | Admitting: Emergency Medicine

## 2014-12-30 LAB — BASIC METABOLIC PANEL
ANION GAP: 6 — AB (ref 7–16)
BUN: 15 mg/dL (ref 7–18)
CHLORIDE: 102 mmol/L (ref 98–107)
CREATININE: 0.73 mg/dL (ref 0.60–1.30)
Calcium, Total: 8.9 mg/dL (ref 8.5–10.1)
Co2: 28 mmol/L (ref 21–32)
EGFR (Non-African Amer.): >60
Glucose: 93 mg/dL (ref 65–99)
Osmolality: 272 (ref 275–301)
Potassium: 4 mmol/L (ref 3.5–5.1)
SODIUM: 136 mmol/L (ref 136–145)

## 2014-12-30 LAB — CBC
HCT: 43 % (ref 35.0–47.0)
HGB: 14 g/dL (ref 12.0–16.0)
MCH: 30.7 pg (ref 26.0–34.0)
MCHC: 32.5 g/dL (ref 32.0–36.0)
MCV: 95 fL (ref 80–100)
Platelet: 239 10*3/uL (ref 150–440)
RBC: 4.55 10*6/uL (ref 3.80–5.20)
RDW: 12.7 % (ref 11.5–14.5)
WBC: 9.5 10*3/uL (ref 3.6–11.0)

## 2014-12-30 LAB — TROPONIN I: Troponin-I: <0.02 ng/mL

## 2015-01-21 ENCOUNTER — Emergency Department: Payer: Self-pay | Admitting: Emergency Medicine

## 2015-01-21 LAB — D-DIMER(ARMC): D-DIMER: 231 ng/mL

## 2015-02-14 ENCOUNTER — Emergency Department: Payer: Self-pay | Admitting: Emergency Medicine

## 2015-07-24 IMAGING — CR DG CHEST 2V
1 series · 2 of 2 positions shown · non-contrast
Comparison: 03/19/2014

CLINICAL DATA: Palpitations.

EXAM:
CHEST - 2 VIEW

[Series 1: pa · 0.17mm/px · 2 of 2 slices shown]
[im 1/2]
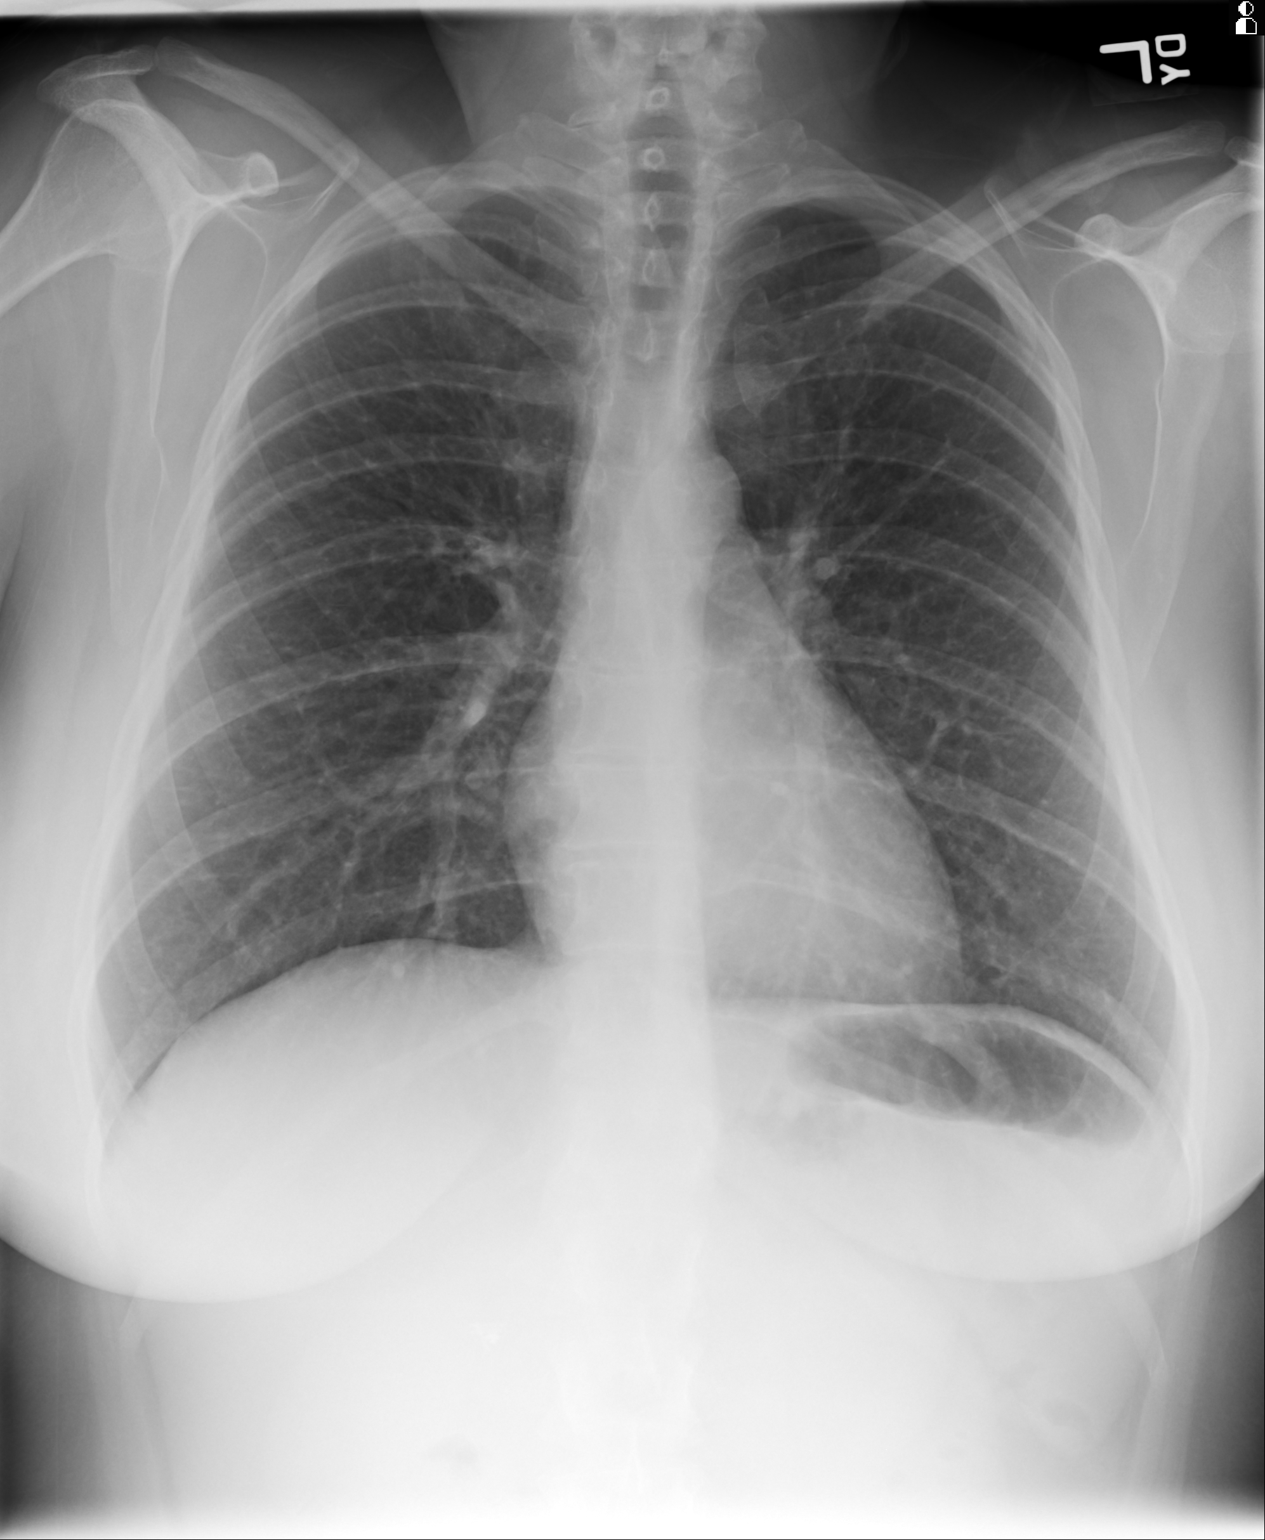
[im 2/2]
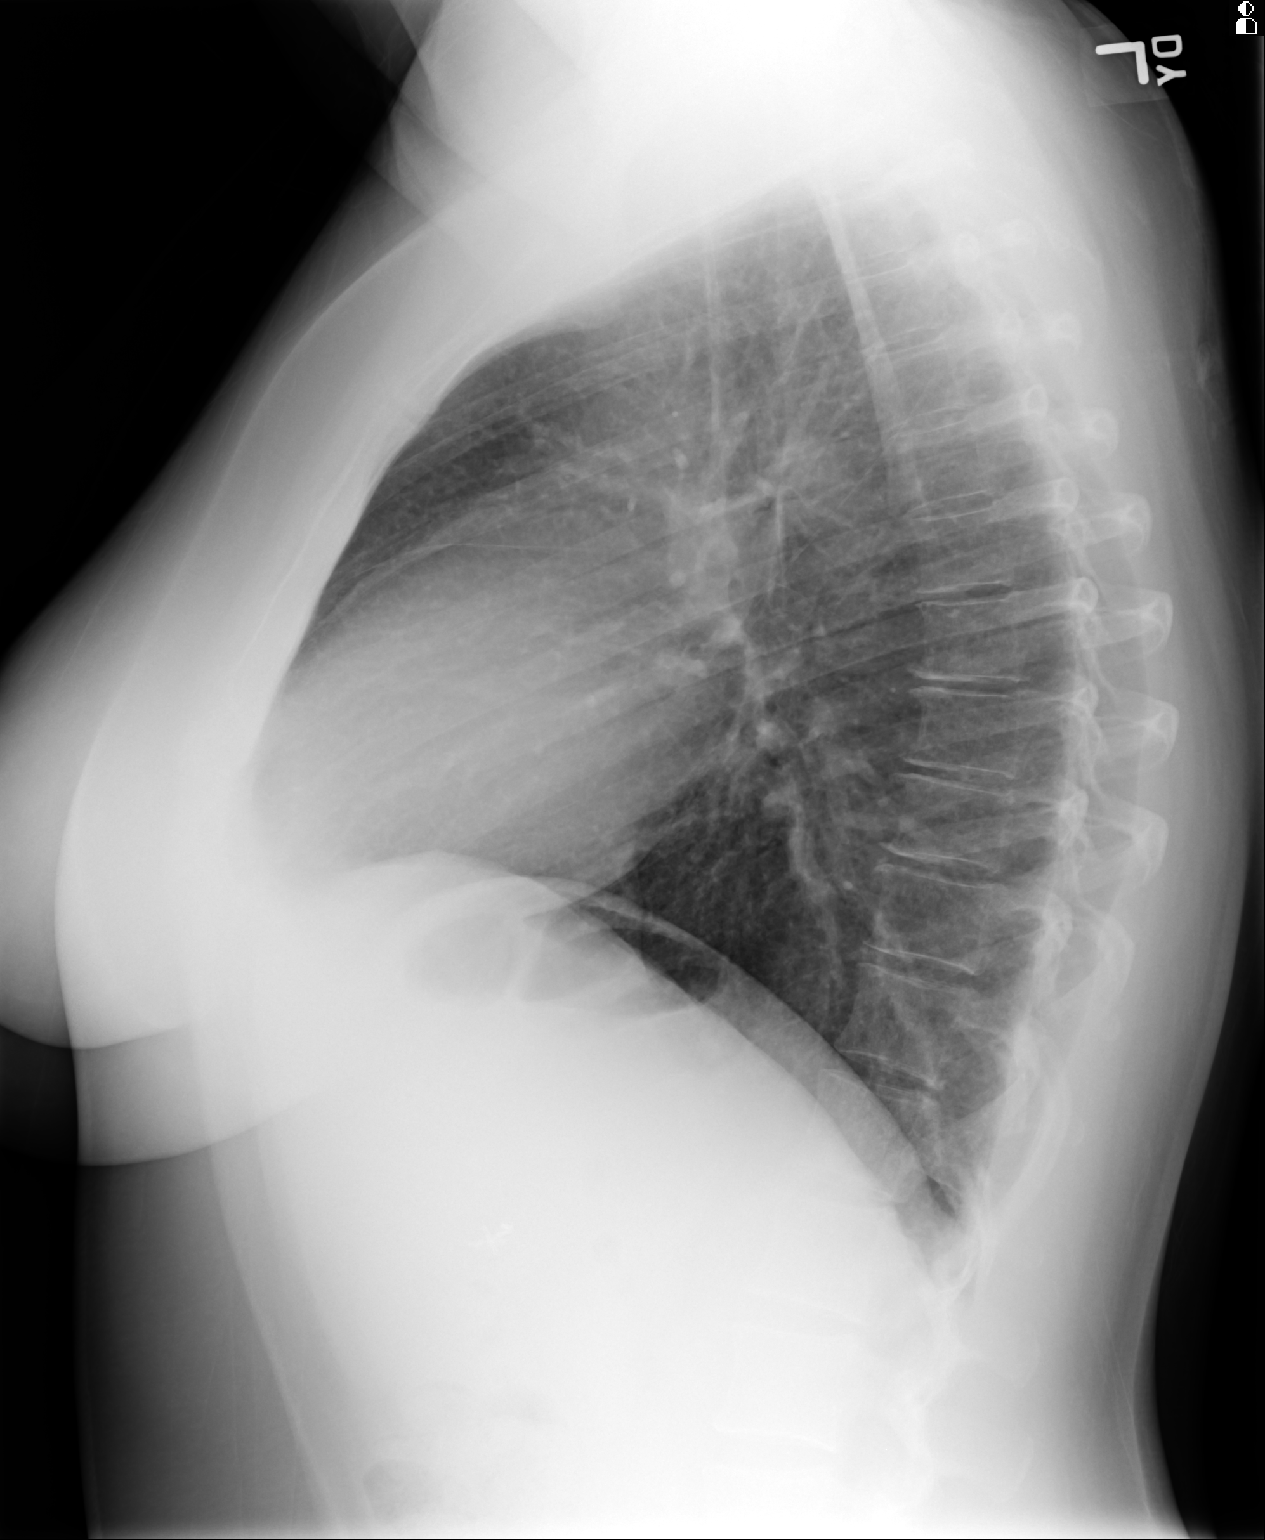

[2 of 2 positions shown; findings below may reference images not displayed]

FINDINGS: The heart size and mediastinal contours are within normal limits.
There is no evidence of pulmonary edema, consolidation,
pneumothorax, nodule or pleural fluid. The visualized skeletal
structures are unremarkable.
IMPRESSION: No active disease.

## 2015-12-29 IMAGING — CR DG CHEST 2V
1 series · 2 of 2 positions shown · non-contrast
Comparison: PA and lateral chest 12/25/2014 and 07/26/2015.

CLINICAL DATA: Chest and upper back pain since 12/24/2014.

EXAM:
CHEST  2 VIEW

[Series 1: dxr chest pa (or ap) and lateral · 0.14mm/px · 2 of 2 slices shown]
[im 1/2]
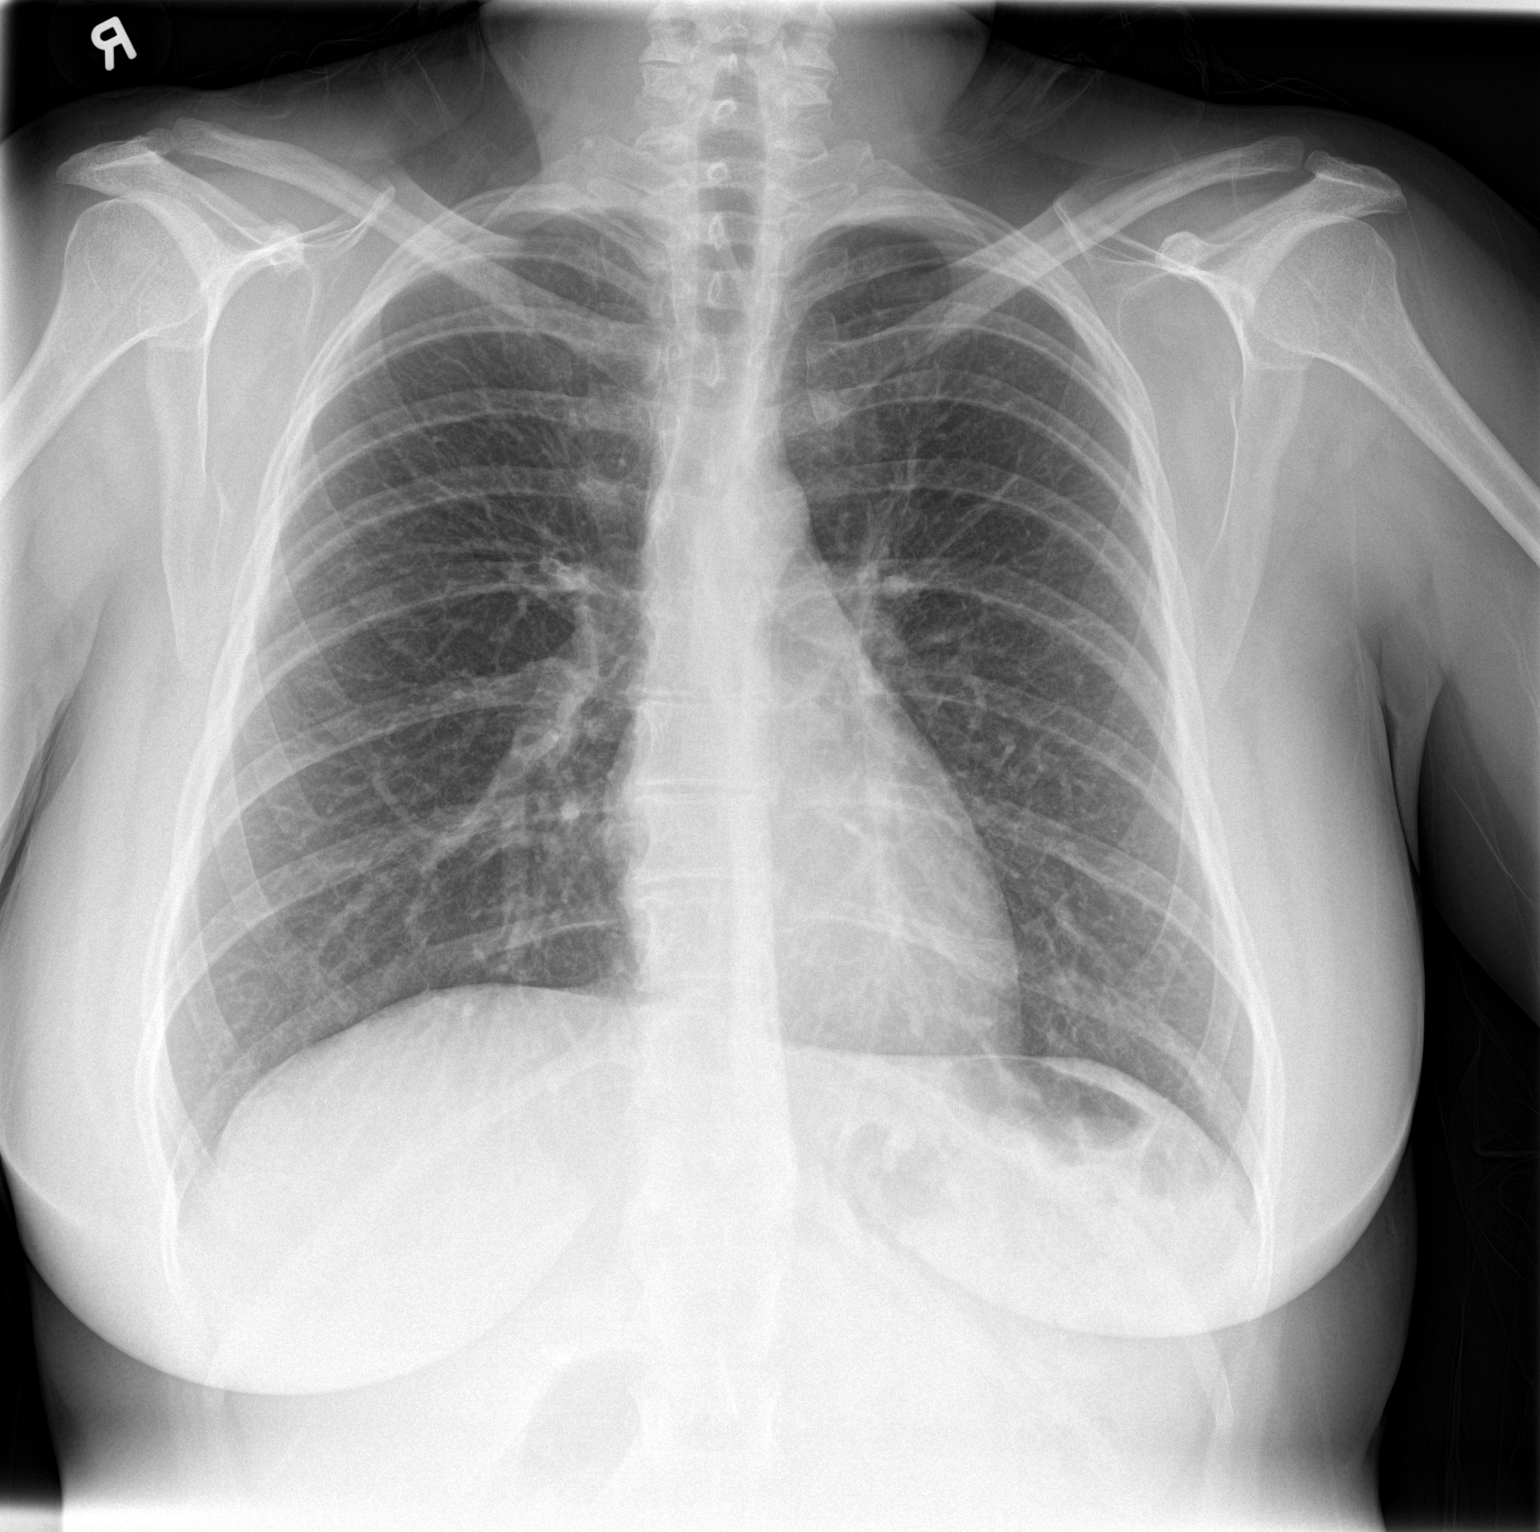
[im 2/2]
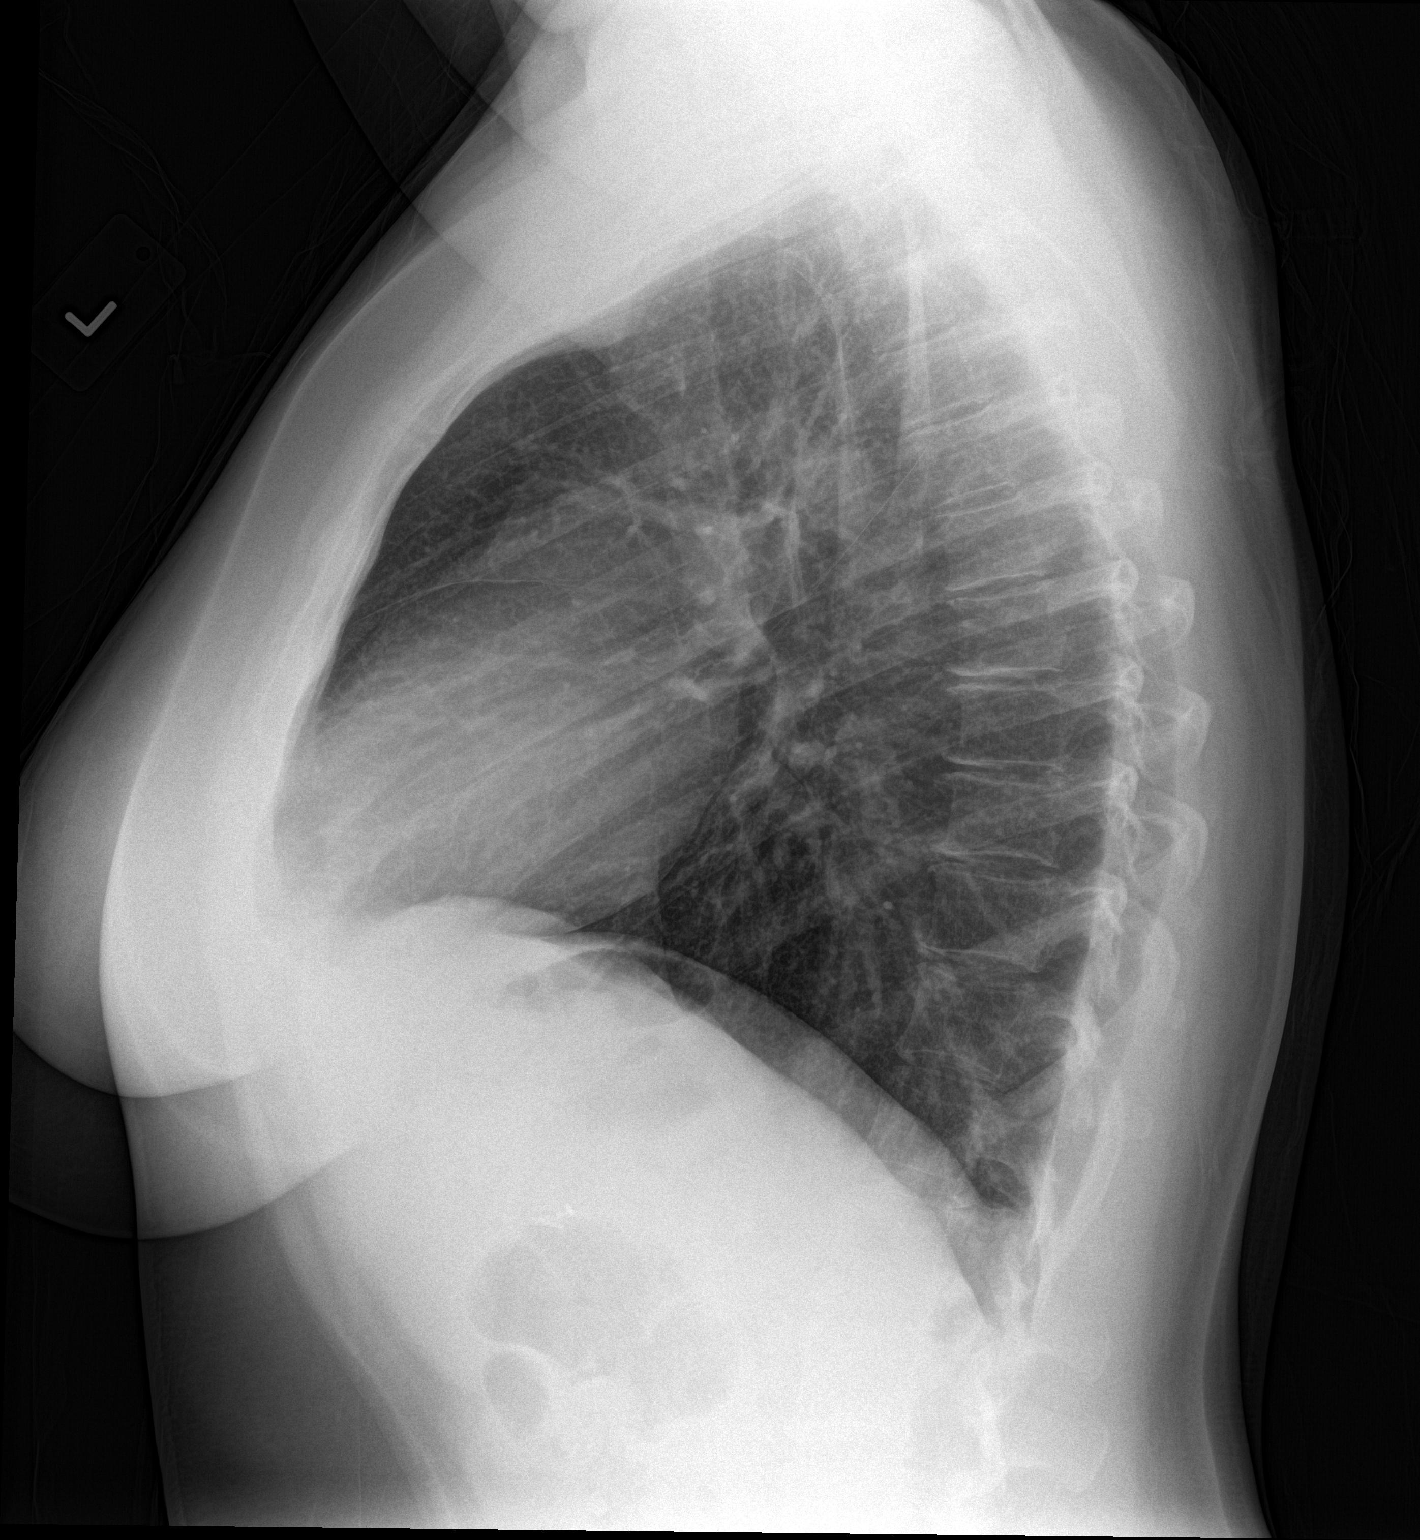

[2 of 2 positions shown; findings below may reference images not displayed]

FINDINGS: Heart size and mediastinal contours are within normal limits. Both
lungs are clear. Visualized skeletal structures are unremarkable.
IMPRESSION: Normal exam.

## 2016-05-10 DIAGNOSIS — F419 Anxiety disorder, unspecified: Secondary | ICD-10-CM | POA: Insufficient documentation

## 2016-05-10 DIAGNOSIS — K2100 Gastro-esophageal reflux disease with esophagitis, without bleeding: Secondary | ICD-10-CM | POA: Insufficient documentation

## 2016-09-07 ENCOUNTER — Encounter: Payer: Self-pay | Admitting: *Deleted

## 2020-02-05 LAB — RESULTS CONSOLE HPV: CHL HPV: NEGATIVE

## 2020-02-05 LAB — HM PAP SMEAR: HM Pap smear: NORMAL

## 2020-03-01 LAB — HM MAMMOGRAPHY

## 2020-11-20 ENCOUNTER — Emergency Department (HOSPITAL_COMMUNITY)
Admission: EM | Admit: 2020-11-20 | Discharge: 2020-11-20 | Disposition: A | Discharge status: Home / Self Care | Payer: BLUE CROSS/BLUE SHIELD | Attending: Emergency Medicine | Admitting: Emergency Medicine

## 2020-11-20 ENCOUNTER — Encounter (HOSPITAL_COMMUNITY): Payer: Self-pay | Admitting: Emergency Medicine

## 2020-11-20 ENCOUNTER — Other Ambulatory Visit: Payer: Self-pay

## 2020-11-20 DIAGNOSIS — R350 Frequency of micturition: Secondary | ICD-10-CM | POA: Diagnosis not present

## 2020-11-20 DIAGNOSIS — R519 Headache, unspecified: Secondary | ICD-10-CM | POA: Insufficient documentation

## 2020-11-20 DIAGNOSIS — D72829 Elevated white blood cell count, unspecified: Secondary | ICD-10-CM | POA: Diagnosis not present

## 2020-11-20 HISTORY — DX: Palpitations: R00.2

## 2020-11-20 HISTORY — DX: Anxiety disorder, unspecified: F41.9

## 2020-11-20 LAB — BASIC METABOLIC PANEL
Anion gap: 10 (ref 5–15)
BUN: 11 mg/dL (ref 6–20)
CO2: 23 mmol/L (ref 22–32)
Calcium: 9.8 mg/dL (ref 8.9–10.3)
Chloride: 101 mmol/L (ref 98–111)
Creatinine, Ser: 0.71 mg/dL (ref 0.44–1.00)
GFR, Estimated: >60 mL/min (ref >60)
Glucose, Bld: 97 mg/dL (ref 70–99)
Potassium: 3.9 mmol/L (ref 3.5–5.1)
Sodium: 134 mmol/L — ABNORMAL LOW (ref 135–145)

## 2020-11-20 LAB — CBC
HCT: 45.4 % (ref 36.0–46.0)
Hemoglobin: 14.7 g/dL (ref 12.0–15.0)
MCH: 30.2 pg (ref 26.0–34.0)
MCHC: 32.4 g/dL (ref 30.0–36.0)
MCV: 93.2 fL (ref 80.0–100.0)
Platelets: 316 10*3/uL (ref 150–400)
RBC: 4.87 MIL/uL (ref 3.87–5.11)
RDW: 12.7 % (ref 11.5–15.5)
WBC: 11 10*3/uL — ABNORMAL HIGH (ref 4.0–10.5)
nRBC: 0 % (ref 0.0–0.2)

## 2020-11-20 LAB — URINALYSIS, ROUTINE W REFLEX MICROSCOPIC
Bacteria, UA: NONE SEEN
Bilirubin Urine: NEGATIVE
Glucose, UA: NEGATIVE mg/dL
Ketones, ur: NEGATIVE mg/dL
Leukocytes,Ua: NEGATIVE
Nitrite: NEGATIVE
Protein, ur: NEGATIVE mg/dL
Specific Gravity, Urine: 1.003 — ABNORMAL LOW (ref 1.005–1.030)
pH: 6 (ref 5.0–8.0)

## 2020-11-20 LAB — CBG MONITORING, ED: Glucose-Capillary: 126 mg/dL — ABNORMAL HIGH (ref 70–99)

## 2020-11-20 MED ORDER — SODIUM CHLORIDE 0.9 % IV BOLUS
500.0000 mL | Freq: Once | INTRAVENOUS | Status: AC
  Administered 2020-11-20: 500 mL via INTRAVENOUS

## 2020-11-20 MED ORDER — PROCHLORPERAZINE EDISYLATE 10 MG/2ML IJ SOLN
10.0000 mg | Freq: Once | INTRAMUSCULAR | Status: AC
  Administered 2020-11-20: 10 mg via INTRAVENOUS
  Filled 2020-11-20: qty 2

## 2020-11-20 NOTE — ED Notes (Signed)
Pt ambulated to restroom with steady gait.  Pt returned to room and is in bed. Gerre Pebbles PA at bedside.

## 2020-11-20 NOTE — ED Provider Notes (Signed)
[facility] EMERGENCY DEPARTMENT Provider Note   CSN: [id number] Arrival date & time: 11/20/20  1127     History Chief Complaint  Patient presents with  . Headache  . Urinary Frequency    Breanna Stevens is a 44 y.o. female with no relevant past medical history presents the ED with complaints of frequent urination and headache.  I reviewed patient's medical record and she was evaluated by her primary care provider on 10/05/2020 when she had complained of dysuria and similar urinary frequency.  UA was unremarkable and urine culture obtained was negative.  She was also complaining of left-sided flank pain for which she was prescribed ciprofloxacin 500 mg twice daily x7 days.  Patient takes propanolol for her chronic palpitations in setting of anxiety.  Patient also takes Cymbalta for her MDD and chronic pain syndrome.  On my exam, patient tells me that yesterday she was eating lunch with a friend when she developed a headache in the right posterior region of her head which radiated anteriorly.  She states that it came on suddenly, but denies any severe pain or neurologic deficits.  She states that it resolved spontaneously shortly thereafter.  It then recurred last night and she took Lyons powder, with some relief.  She describes it as a waxing and waning headache.  She does endorse a history of migraine disorder.  She had been taking duloxetine for chronic pain, but discontinued because she admittedly has health anxiety and was concerned that it was causing liver impairment.  She reports that she has been more anxious recently because her mother almost died 6 weeks ago from a ruptured brain aneurysm.  She suspects that may be contributing to her concern regarding her headaches today.  While she endorsed mild associated nausea, she denies any emesis or abdominal pain.  No recent fevers or chills.  She also states that she has a history of trapezial muscle spasms.  She used to take  muscle relaxants, but was concerned about potential interaction with her metoprolol so she discontinued.  Additionally, she complains of 10+ urinations each day, has gone from cloudy to clear.  She states that she was taking ciprofloxacin a few days ago, but did not take any today.  She took her due to her increased urinary frequency and cloudy appearing urine.  She denies any current flank pain, chest pain or shortness of breath, recent fevers or chills, diminished range of motion of neck, blurred vision, numbness or weakness, or other focal neurologic deficits.  HPI     Past Medical History:  Diagnosis Date  . Anxiety   . Palpitations     There are no problems to display for this patient.   Past Surgical History:  Procedure Laterality Date  . CHOLECYSTECTOMY    . OOPHORECTOMY       OB History   No obstetric history on file.     No family history on file.  Social History   Tobacco Use  . Smoking status: Never Smoker  . Smokeless tobacco: Never Used  Vaping Use  . Vaping Use: Every day  Substance Use Topics  . Alcohol use: No  . Drug use: Not Currently    Home Medications Prior to Admission medications   Medication Sig Start Date End Date Taking? Authorizing Provider  ALPRAZolam Prudy Feeler) 0.5 MG tablet Take 0.5 mg by mouth 2 (two) times daily as needed. For anxiety     [provider]  Ascorbic Acid (VITAMIN C) 1000  MG tablet Take 1,000 mg by mouth daily.      [provider]  cephALEXin (KEFLEX) 250 MG capsule Take 1 capsule (250 mg total) by mouth 4 (four) times daily. 03/26/14   Earley Favor, NP  cholecalciferol (VITAMIN D) 1000 UNITS tablet Take 1,000 Units by mouth daily.      [provider]  esomeprazole (NEXIUM) 40 MG capsule Increase Nexium to twice a day 10/30/11   Nelva Nay, MD  ibuprofen (ADVIL,MOTRIN) 800 MG tablet Take 800 mg by mouth every 8 (eight) hours as needed. For pain     [provider]  sucralfate  (CARAFATE) 1 G tablet Take 1 tablet (1 g total) by mouth 4 (four) times daily. 10/30/11 10/29/12  Nelva Nay, MD  vitamin B-12 (CYANOCOBALAMIN) 1000 MCG tablet Take 1,000 mcg by mouth daily.      [provider]  vitamin E 400 UNIT capsule Take 800 Units by mouth daily.      [provider]    Allergies    Sulfa antibiotics  Review of Systems   Review of Systems  All other systems reviewed and are negative.   Physical Exam Updated Vital Signs BP 107/76   Pulse 90   Temp 98.7 F (37.1 C) (Oral)   Resp 16   Ht 5\' 5"  (1.651 m)   Wt 93 kg   LMP 11/06/2020   SpO2 98%   BMI 34.11 kg/m   Physical Exam Vitals and nursing note reviewed. Exam conducted with a chaperone present.  Constitutional:      Appearance: Normal appearance.  HENT:     Head: Normocephalic and atraumatic.     Mouth/Throat:     Mouth: Mucous membranes are dry.  Eyes:     General: No scleral icterus.    Extraocular Movements: Extraocular movements intact.     Conjunctiva/sclera: Conjunctivae normal.     Pupils: Pupils are equal, round, and reactive to light.     Comments: PERRL, EOM intact.  No nystagmus.  Neck:     Comments: No meningismus. Cardiovascular:     Rate and Rhythm: Normal rate and regular rhythm.     Pulses: Normal pulses.     Comments: Borderline tachycardia. Pulmonary:     Effort: Pulmonary effort is normal. No respiratory distress.  Abdominal:     General: There is no distension.     Tenderness: There is no abdominal tenderness.  Musculoskeletal:     Cervical back: Normal range of motion and neck supple. No rigidity.  Skin:    General: Skin is dry.  Neurological:     General: No focal deficit present.     Mental Status: She is alert and oriented to person, place, and time.     GCS: GCS eye subscore is 4. GCS verbal subscore is 5. GCS motor subscore is 6.     Cranial Nerves: No cranial nerve deficit.     Sensory: No sensory deficit.     Motor: No weakness.      Coordination: Coordination normal.     Gait: Gait normal.     Comments: CN II through XII grossly intact.  Normal exam.  Ambulates without ataxia or difficulty.  Negative Brudzinski test.  Psychiatric:        Mood and Affect: Mood normal.        Behavior: Behavior normal.        Thought Content: Thought content normal.     ED Results / Procedures / Treatments  Labs (all labs ordered are listed, but only abnormal results are displayed) Labs Reviewed  URINALYSIS, ROUTINE W REFLEX MICROSCOPIC - Abnormal; Notable for the following components:      Result Value   Color, Urine COLORLESS (*)    Specific Gravity, Urine 1.003 (*)    Hgb urine dipstick SMALL (*)    All other components within normal limits  CBC - Abnormal; Notable for the following components:   WBC 11.0 (*)    All other components within normal limits  BASIC METABOLIC PANEL - Abnormal; Notable for the following components:   Sodium 134 (*)    All other components within normal limits  CBG MONITORING, ED - Abnormal; Notable for the following components:   Glucose-Capillary 126 (*)    All other components within normal limits  I-STAT BETA HCG BLOOD, ED ([facility], [facility], [facility] ONLY)    EKG None  Radiology No results found.  Procedures Procedures (including critical care time)  Medications Ordered in ED Medications  sodium chloride 0.9 % bolus 500 mL (500 mLs Intravenous New Bag/Given 11/20/20 1358)  prochlorperazine (COMPAZINE) injection 10 mg (10 mg Intravenous Given 11/20/20 1355)    ED Course  I have reviewed the triage vital signs and the nursing notes.  Pertinent labs & imaging results that were available during my care of the patient were reviewed by me and considered in my medical decision making (see chart for details).    MDM Rules/Calculators/A&P                          Patient with a history of migraine headaches is presenting for intermittent right-sided headaches with associated nausea.  She also states  that she is having increased urinary frequency x6+ weeks.  Patient has already been worked up by her primary care provider.  She admits that she is anxious because her mother recently had a brain aneurysm.  Her neurologic exam is entirely benign and without any focal deficits.  She is resting comfortably and in no acute distress.  She does appear to be mildly anxious and her mouth is dry, suspect that is why she has borderline tachycardia.  Will provide her with 500 mL IV NS and Compazine for her headache.  Suspect that she would benefit from continued outpatient work-up and referral to neurology should her headache symptoms continue to persist.    Presentation is like pts typical HA and non concerning for [facility], ICH, Meningitis, or temporal arteritis. Pt is afebrile with no focal neuro deficits, nuchal rigidity, or change in vision.  There are answered questions appropriately and they deny any confusion otherwise concerning for encephalitis.  I also have lower suspicion for sentinel bleed given lack of maximal intensity at onset symptoms.  Labs CBC: No anemia.  Mild leukocytosis at 11.0. BMP: Mild hyponatremia 134, otherwise entirely within normal limits. UA: No evidence of infection.  Small, 0-5 hemoglobin.  Colorless, low specific gravity. I-STAT beta-hCG: Patient adamant that she is not pregnant.  Patient is reassured by her laboratory work-up.  After administration of Compazine, she feels as though her symptoms have improved.  Discussed CT imaging with patient and she agrees that it is not warranted at this time given low suspicion for emergent pathology.  However, she would appreciate referral to neurology given her headache symptoms which I feel is reasonable.  She will also follow-up with her primary care provider regarding her increased urinary frequency.  However, on my examination she tells  me that she drinks an excessive amount of coffee and unsweetened ice tea which she understands may be  contributing to her increased urinary frequency.  ED return precautions discussed.  Patient voices understanding and is agreeable to the plan.  Final Clinical Impression(s) / ED Diagnoses Final diagnoses:  Acute nonintractable headache, unspecified headache type    Rx / DC Orders ED Discharge Orders    None       Lorelee New, PA-C 11/20/20 1459    Cheryll Cockayne, MD 11/25/20 [phone]

## 2020-11-20 NOTE — ED Triage Notes (Signed)
Pt reports intermittent R posterior headache that started yesterday with nausea.  Today pain radiates to forehead.  States she had a normal urinalysis 6 weeks ago for frequent urination.  Dr wrote a Rx for Cipro in case culture came back positive.  Culture was negative.  She started Cipro 2 days ago because she continued to have frequent urination. No neuro deficits.

## 2020-11-20 NOTE — Discharge Instructions (Signed)
Please follow-up with your primary care provider for ongoing evaluation and management of your increased urinary frequency.  Your urine analysis here was unremarkable.  I do recommend that you mitigate your coffee and unsweetened ice tea intake as these may be contributing factors.  I am glad that your headache improved with Compazine here in the ED.  Based on your history and physical exam, I have lower suspicion for acute or emergent pathology.  However, I would like you to follow-up with neurology for ongoing evaluation and management of your headache symptoms.  Please call [facility] to schedule an appointment.  Should your headache return, I recommend 600 mg ibuprofen every 6 hours as needed.  Return to the ED or seek immediate medical attention should you experience any new or worsening symptoms.

## 2021-04-11 ENCOUNTER — Emergency Department: Payer: Self-pay

## 2021-04-11 ENCOUNTER — Other Ambulatory Visit: Payer: Self-pay

## 2021-04-11 ENCOUNTER — Emergency Department
Admission: EM | Admit: 2021-04-11 | Discharge: 2021-04-11 | Disposition: A | Discharge status: Home / Self Care | Payer: Self-pay | Attending: Emergency Medicine | Admitting: Emergency Medicine

## 2021-04-11 DIAGNOSIS — M5412 Radiculopathy, cervical region: Secondary | ICD-10-CM | POA: Insufficient documentation

## 2021-04-11 DIAGNOSIS — G4486 Cervicogenic headache: Secondary | ICD-10-CM | POA: Insufficient documentation

## 2021-04-11 NOTE — ED Triage Notes (Signed)
Pt comes with c/o headache and pain to scalp. Pt states awhile back she had mirror fall onto her head. Pt states some nausea. Pt states it hurts more on left side and radiates down to ear.

## 2021-04-11 NOTE — ED Notes (Signed)
See triage note  Presents with h/a    States pain is mainly to top of scalp on the left and moves down to her ear  Started about 2 weeks ago  Describe pain as 'sensation"

## 2021-04-11 NOTE — ED Provider Notes (Signed)
[facility] Emergency Department Provider Note   ____________________________________________   Event Date/Time   First MD Initiated Contact with Patient 04/11/21 1240     (approximate)  I have reviewed the triage vital signs and the nursing notes.   HISTORY  Chief Complaint Headache    HPI Breanna Stevens is a 45 y.o. female with the below stated past medical history who presents for left scalp, ear, and facial pain.  Patient states that there is a spot to the left temporal scalp that is sensitive to palpation as well as causes radiating pain down the left aspect of her face and ear.  Patient endorses recent trauma of approximately 2 months ago having a mirror fall onto the top of her head.  Patient states that this pain however did not start until 2 weeks ago and has been stable since onset.  Patient denies any relieving factors.  Patient currently denies any vision changes, tinnitus, difficulty speaking, facial droop, sore throat, chest pain, shortness of breath, abdominal pain, nausea/vomiting/diarrhea, dysuria, or weakness/numbness/paresthesias in any extremity         Past Medical History:  Diagnosis Date  . Anxiety   . Palpitations     There are no problems to display for this patient.   Past Surgical History:  Procedure Laterality Date  . CHOLECYSTECTOMY    . OOPHORECTOMY      Prior to Admission medications   Medication Sig Start Date End Date Taking? Authorizing Provider  ALPRAZolam Prudy Feeler) 0.5 MG tablet Take 0.5 mg by mouth 2 (two) times daily as needed. For anxiety     [provider]  Ascorbic Acid (VITAMIN C) 1000 MG tablet Take 1,000 mg by mouth daily.      [provider]  cephALEXin (KEFLEX) 250 MG capsule Take 1 capsule (250 mg total) by mouth 4 (four) times daily. 03/26/14   Earley Favor, NP  cholecalciferol (VITAMIN D) 1000 UNITS tablet Take 1,000 Units by mouth daily.      [provider]   esomeprazole (NEXIUM) 40 MG capsule Increase Nexium to twice a day 10/30/11   Nelva Nay, MD  ibuprofen (ADVIL,MOTRIN) 800 MG tablet Take 800 mg by mouth every 8 (eight) hours as needed. For pain     [provider]  sucralfate (CARAFATE) 1 G tablet Take 1 tablet (1 g total) by mouth 4 (four) times daily. 10/30/11 10/29/12  Nelva Nay, MD  vitamin B-12 (CYANOCOBALAMIN) 1000 MCG tablet Take 1,000 mcg by mouth daily.      [provider]  vitamin E 400 UNIT capsule Take 800 Units by mouth daily.      [provider]    Allergies Sulfa antibiotics  No family history on file.  Social History Social History   Tobacco Use  . Smoking status: Never Smoker  . Smokeless tobacco: Never Used  Vaping Use  . Vaping Use: Every day  Substance Use Topics  . Alcohol use: No  . Drug use: Not Currently    Review of Systems Constitutional: No fever/chills Eyes: No visual changes. ENT: No sore throat. Cardiovascular: Denies chest pain. Respiratory: Denies shortness of breath. Gastrointestinal: No abdominal pain.  No nausea, no vomiting.  No diarrhea. Genitourinary: Negative for dysuria. Musculoskeletal: Negative for acute arthralgias Skin: Negative for rash. Neurological: Positive for headaches, negative for weakness/numbness/paresthesias in any extremity Psychiatric: Negative for suicidal ideation/homicidal ideation   ____________________________________________   PHYSICAL EXAM:  VITAL SIGNS: ED Triage Vitals  Enc Vitals Group  BP 04/11/21 1206 135/81     Pulse Rate 04/11/21 1206 91     Resp 04/11/21 1206 18     Temp 04/11/21 1206 98.2 F (36.8 C)     Temp Source 04/11/21 1206 Oral     SpO2 04/11/21 1206 99 %     Weight 04/11/21 1216 205 lb 0.4 oz (93 kg)     Height 04/11/21 1216 5\' 5"  (1.651 m)     Head Circumference --      Peak Flow --      Pain Score 04/11/21 1205 4     Pain Loc --      Pain Edu? --      Excl. in GC? --     Constitutional: Alert and oriented. Well appearing overweight middle-aged Caucasian female in no acute distress. Eyes: Conjunctivae are normal. PERRL. Head: Atraumatic. Nose: No congestion/rhinnorhea. Mouth/Throat: Mucous membranes are moist. Neck: No stridor Cardiovascular: Grossly normal heart sounds.  Good peripheral circulation. Respiratory: Normal respiratory effort.  No retractions. Gastrointestinal: Soft and nontender. No distention. Musculoskeletal: No obvious deformities Neurologic:  Normal speech and language. No gross focal neurologic deficits are appreciated. Skin:  Skin is warm and dry. No rash noted. Psychiatric: Mood and affect are normal. Speech and behavior are normal.  ____________________________________________   LABS (all labs ordered are listed, but only abnormal results are displayed)  Labs Reviewed - No data to display  RADIOLOGY  ED MD interpretation: CT of the head shows no evidence of acute abnormalities but does show incidental finding of partially empty sella  Official radiology report(s): CT Head Wo Contrast  Result Date: 04/11/2021 CLINICAL DATA:  Head trauma, focal neuro findings. Additional history provided: Patient reports headache and pain to scalp, patient reports a mirror fell onto her head, nausea, pain more on the left side and radiating down to ear. EXAM: CT HEAD WITHOUT CONTRAST TECHNIQUE: Contiguous axial images were obtained from the base of the skull through the vertex without intravenous contrast. COMPARISON:  Prior head CT examinations 04/11/2014 and earlier. FINDINGS: Brain: Please note the foramen magnum and cerebellar tonsils are not entirely included in the field of view. Cerebral volume is normal. There is no acute intracranial hemorrhage. No demarcated cortical infarct. No extra-axial fluid collection. No evidence of intracranial mass. No midline shift. Partially empty sella turcica. Vascular: No hyperdense vessel. Skull: Normal.  Negative for fracture or focal lesion. Sinuses/Orbits: Visualized orbits show no acute finding. No significant paranasal sinus disease at the imaged levels. IMPRESSION: The foramen magnum and cerebellar tonsils are not entirely included in the field of view. Within this limitation, there is no evidence of acute intracranial abnormality. Partially empty sella turcica. This finding is very commonly incidental, but can be associated with idiopathic intracranial hypertension. Electronically Signed   By: 04/13/2014 DO   On: 04/11/2021 13:35    ____________________________________________   PROCEDURES  Procedure(s) performed (including Critical Care):  Procedures   ____________________________________________   INITIAL IMPRESSION / ASSESSMENT AND PLAN / ED COURSE  As part of my medical decision making, I reviewed the following data within the electronic MEDICAL RECORD NUMBER Nursing notes reviewed and incorporated, Old chart reviewed, Radiograph reviewed and Notes from prior ED visits reviewed and incorporated        45 year old female presents with Headache.  No focal neurological symptoms. Neuro exam is benign. Pt is nontoxic. VSS.  Based on history and normal neurological exam I have low suspicion for intracranial tumor, intracranial bleed, meningitis, temporal arteritis, glaucoma,  CO poisoning.  Most likely patient has benign headache, recommend rest, hydration, and ibuprofen.  Disposition: Discharge home with strict return precautions and instructions for prompt primary care follow up.      ____________________________________________   FINAL CLINICAL IMPRESSION(S) / ED DIAGNOSES  Final diagnoses:  Cervicogenic headache  Cervical radiculopathy     ED Discharge Orders    None       Note:  This document was prepared using Dragon voice recognition software and may include unintentional dictation errors.   Merwyn Katos, MD 04/11/21 1416

## 2023-11-08 DIAGNOSIS — R4589 Other symptoms and signs involving emotional state: Secondary | ICD-10-CM | POA: Insufficient documentation

## 2024-05-13 DIAGNOSIS — F341 Dysthymic disorder: Secondary | ICD-10-CM | POA: Diagnosis not present

## 2024-05-13 DIAGNOSIS — F411 Generalized anxiety disorder: Secondary | ICD-10-CM | POA: Diagnosis not present

## 2024-05-25 DIAGNOSIS — F341 Dysthymic disorder: Secondary | ICD-10-CM | POA: Diagnosis not present

## 2024-05-25 DIAGNOSIS — F411 Generalized anxiety disorder: Secondary | ICD-10-CM | POA: Diagnosis not present

## 2024-06-08 DIAGNOSIS — F341 Dysthymic disorder: Secondary | ICD-10-CM | POA: Diagnosis not present

## 2024-06-08 DIAGNOSIS — F411 Generalized anxiety disorder: Secondary | ICD-10-CM | POA: Diagnosis not present

## 2024-06-15 DIAGNOSIS — F341 Dysthymic disorder: Secondary | ICD-10-CM | POA: Diagnosis not present

## 2024-06-15 DIAGNOSIS — F411 Generalized anxiety disorder: Secondary | ICD-10-CM | POA: Diagnosis not present

## 2024-06-17 ENCOUNTER — Ambulatory Visit: Admitting: General Practice

## 2024-06-17 DIAGNOSIS — Z7689 Persons encountering health services in other specified circumstances: Secondary | ICD-10-CM

## 2024-06-22 DIAGNOSIS — F341 Dysthymic disorder: Secondary | ICD-10-CM | POA: Diagnosis not present

## 2024-06-22 DIAGNOSIS — F411 Generalized anxiety disorder: Secondary | ICD-10-CM | POA: Diagnosis not present

## 2024-06-29 DIAGNOSIS — F341 Dysthymic disorder: Secondary | ICD-10-CM | POA: Diagnosis not present

## 2024-06-29 DIAGNOSIS — F411 Generalized anxiety disorder: Secondary | ICD-10-CM | POA: Diagnosis not present

## 2024-07-06 DIAGNOSIS — F341 Dysthymic disorder: Secondary | ICD-10-CM | POA: Diagnosis not present

## 2024-07-06 DIAGNOSIS — F411 Generalized anxiety disorder: Secondary | ICD-10-CM | POA: Diagnosis not present

## 2024-07-13 DIAGNOSIS — F341 Dysthymic disorder: Secondary | ICD-10-CM | POA: Diagnosis not present

## 2024-07-13 DIAGNOSIS — F411 Generalized anxiety disorder: Secondary | ICD-10-CM | POA: Diagnosis not present

## 2024-07-20 DIAGNOSIS — F341 Dysthymic disorder: Secondary | ICD-10-CM | POA: Diagnosis not present

## 2024-07-20 DIAGNOSIS — F411 Generalized anxiety disorder: Secondary | ICD-10-CM | POA: Diagnosis not present

## 2024-08-10 DIAGNOSIS — F341 Dysthymic disorder: Secondary | ICD-10-CM | POA: Diagnosis not present

## 2024-08-10 DIAGNOSIS — F411 Generalized anxiety disorder: Secondary | ICD-10-CM | POA: Diagnosis not present

## 2024-08-12 ENCOUNTER — Ambulatory Visit: Admitting: General Practice

## 2024-08-24 DIAGNOSIS — F341 Dysthymic disorder: Secondary | ICD-10-CM | POA: Diagnosis not present

## 2024-08-24 DIAGNOSIS — F411 Generalized anxiety disorder: Secondary | ICD-10-CM | POA: Diagnosis not present
